# Patient Record
Sex: Female | Born: 1950 | Race: White | Hispanic: No | Marital: Married | Smoking: Never smoker
Health system: Southern US, Community
[De-identification: ages and names within clinical notes are randomized; demographics above are authoritative.]

## PROBLEM LIST (undated history)

## (undated) DIAGNOSIS — F329 Major depressive disorder, single episode, unspecified: Secondary | ICD-10-CM

## (undated) DIAGNOSIS — F411 Generalized anxiety disorder: Secondary | ICD-10-CM

## (undated) DIAGNOSIS — E039 Hypothyroidism, unspecified: Secondary | ICD-10-CM

## (undated) DIAGNOSIS — R079 Chest pain, unspecified: Secondary | ICD-10-CM

## (undated) DIAGNOSIS — G4700 Insomnia, unspecified: Secondary | ICD-10-CM

## (undated) DIAGNOSIS — I10 Essential (primary) hypertension: Secondary | ICD-10-CM

## (undated) DIAGNOSIS — E7800 Pure hypercholesterolemia, unspecified: Secondary | ICD-10-CM

## (undated) DIAGNOSIS — N950 Postmenopausal bleeding: Secondary | ICD-10-CM

## (undated) DIAGNOSIS — R109 Unspecified abdominal pain: Secondary | ICD-10-CM

## (undated) DIAGNOSIS — R011 Cardiac murmur, unspecified: Secondary | ICD-10-CM

## (undated) DIAGNOSIS — G1221 Amyotrophic lateral sclerosis: Secondary | ICD-10-CM

## (undated) DIAGNOSIS — IMO0001 Reserved for inherently not codable concepts without codable children: Secondary | ICD-10-CM

## (undated) DIAGNOSIS — F3289 Other specified depressive episodes: Secondary | ICD-10-CM

## (undated) HISTORY — DX: Insomnia, unspecified: G47.00

## (undated) HISTORY — DX: Pure hypercholesterolemia, unspecified: E78.00

## (undated) HISTORY — DX: Major depressive disorder, single episode, unspecified: F32.9

## (undated) HISTORY — DX: Chest pain, unspecified: R07.9

## (undated) HISTORY — DX: Cardiac murmur, unspecified: R01.1

## (undated) HISTORY — PX: DILATION AND CURETTAGE OF UTERUS: SHX78

## (undated) HISTORY — DX: Hypothyroidism, unspecified: E03.9

## (undated) HISTORY — DX: Amyotrophic lateral sclerosis: G12.21

## (undated) HISTORY — DX: Other specified depressive episodes: F32.89

## (undated) HISTORY — DX: Unspecified abdominal pain: R10.9

## (undated) HISTORY — DX: Generalized anxiety disorder: F41.1

## (undated) HISTORY — DX: Postmenopausal bleeding: N95.0

## (undated) HISTORY — PX: OTHER SURGICAL HISTORY: SHX169

## (undated) HISTORY — DX: Essential (primary) hypertension: I10

---

## 2006-09-30 ENCOUNTER — Emergency Department: Payer: Self-pay | Admitting: Emergency Medicine

## 2009-08-16 HISTORY — PX: COLONOSCOPY: SHX174

## 2009-08-16 LAB — HM COLONOSCOPY: HM Colonoscopy: NORMAL

## 2009-10-23 ENCOUNTER — Ambulatory Visit: Payer: Self-pay | Admitting: Family Medicine

## 2010-01-19 ENCOUNTER — Ambulatory Visit: Payer: Self-pay | Admitting: Gastroenterology

## 2010-05-08 LAB — HM PAP SMEAR

## 2011-12-21 DIAGNOSIS — E7800 Pure hypercholesterolemia, unspecified: Secondary | ICD-10-CM | POA: Diagnosis not present

## 2011-12-21 DIAGNOSIS — Z0000 Encounter for general adult medical examination without abnormal findings: Secondary | ICD-10-CM | POA: Diagnosis not present

## 2011-12-21 DIAGNOSIS — E039 Hypothyroidism, unspecified: Secondary | ICD-10-CM | POA: Diagnosis not present

## 2011-12-21 DIAGNOSIS — R079 Chest pain, unspecified: Secondary | ICD-10-CM | POA: Diagnosis not present

## 2011-12-27 DIAGNOSIS — I209 Angina pectoris, unspecified: Secondary | ICD-10-CM | POA: Diagnosis not present

## 2011-12-29 DIAGNOSIS — E039 Hypothyroidism, unspecified: Secondary | ICD-10-CM | POA: Diagnosis not present

## 2011-12-29 DIAGNOSIS — N950 Postmenopausal bleeding: Secondary | ICD-10-CM | POA: Diagnosis not present

## 2011-12-29 DIAGNOSIS — E7800 Pure hypercholesterolemia, unspecified: Secondary | ICD-10-CM | POA: Diagnosis not present

## 2012-01-04 DIAGNOSIS — F411 Generalized anxiety disorder: Secondary | ICD-10-CM | POA: Diagnosis not present

## 2012-01-04 DIAGNOSIS — R109 Unspecified abdominal pain: Secondary | ICD-10-CM | POA: Diagnosis not present

## 2012-01-04 DIAGNOSIS — R079 Chest pain, unspecified: Secondary | ICD-10-CM | POA: Diagnosis not present

## 2012-01-11 DIAGNOSIS — N950 Postmenopausal bleeding: Secondary | ICD-10-CM | POA: Diagnosis not present

## 2012-01-25 DIAGNOSIS — F411 Generalized anxiety disorder: Secondary | ICD-10-CM | POA: Diagnosis not present

## 2012-01-25 DIAGNOSIS — R0602 Shortness of breath: Secondary | ICD-10-CM | POA: Diagnosis not present

## 2012-01-25 DIAGNOSIS — R079 Chest pain, unspecified: Secondary | ICD-10-CM | POA: Diagnosis not present

## 2012-01-25 DIAGNOSIS — F329 Major depressive disorder, single episode, unspecified: Secondary | ICD-10-CM | POA: Diagnosis not present

## 2012-01-27 DIAGNOSIS — N950 Postmenopausal bleeding: Secondary | ICD-10-CM | POA: Diagnosis not present

## 2012-02-01 ENCOUNTER — Ambulatory Visit: Payer: Self-pay | Admitting: Family Medicine

## 2012-02-01 DIAGNOSIS — R0602 Shortness of breath: Secondary | ICD-10-CM | POA: Diagnosis not present

## 2012-02-02 DIAGNOSIS — N950 Postmenopausal bleeding: Secondary | ICD-10-CM | POA: Diagnosis not present

## 2012-02-02 DIAGNOSIS — N393 Stress incontinence (female) (male): Secondary | ICD-10-CM | POA: Diagnosis not present

## 2012-02-02 DIAGNOSIS — N859 Noninflammatory disorder of uterus, unspecified: Secondary | ICD-10-CM | POA: Diagnosis not present

## 2012-02-11 DIAGNOSIS — N814 Uterovaginal prolapse, unspecified: Secondary | ICD-10-CM | POA: Diagnosis not present

## 2012-02-11 DIAGNOSIS — N393 Stress incontinence (female) (male): Secondary | ICD-10-CM | POA: Diagnosis not present

## 2012-02-18 DIAGNOSIS — N950 Postmenopausal bleeding: Secondary | ICD-10-CM | POA: Diagnosis not present

## 2012-02-18 DIAGNOSIS — N393 Stress incontinence (female) (male): Secondary | ICD-10-CM | POA: Diagnosis not present

## 2012-04-16 HISTORY — PX: ABDOMINAL HYSTERECTOMY: SHX81

## 2012-04-24 ENCOUNTER — Ambulatory Visit: Payer: Self-pay | Admitting: Obstetrics & Gynecology

## 2012-04-24 DIAGNOSIS — Z01818 Encounter for other preprocedural examination: Secondary | ICD-10-CM | POA: Diagnosis not present

## 2012-04-24 DIAGNOSIS — N393 Stress incontinence (female) (male): Secondary | ICD-10-CM | POA: Diagnosis not present

## 2012-04-24 DIAGNOSIS — Z01812 Encounter for preprocedural laboratory examination: Secondary | ICD-10-CM | POA: Diagnosis not present

## 2012-04-24 DIAGNOSIS — Z01810 Encounter for preprocedural cardiovascular examination: Secondary | ICD-10-CM | POA: Diagnosis not present

## 2012-04-24 DIAGNOSIS — N950 Postmenopausal bleeding: Secondary | ICD-10-CM | POA: Diagnosis not present

## 2012-04-24 LAB — BASIC METABOLIC PANEL
Anion Gap: 6 — ABNORMAL LOW (ref 7–16)
BUN: 17 mg/dL (ref 7–18)
Chloride: 109 mmol/L — ABNORMAL HIGH (ref 98–107)
Creatinine: 0.78 mg/dL (ref 0.60–1.30)
Potassium: 4 mmol/L (ref 3.5–5.1)

## 2012-04-24 LAB — CBC
MCV: 94 fL (ref 80–100)
Platelet: 215 10*3/uL (ref 150–440)
RBC: 3.73 10*6/uL — ABNORMAL LOW (ref 3.80–5.20)
RDW: 12.7 % (ref 11.5–14.5)
WBC: 5.9 10*3/uL (ref 3.6–11.0)

## 2012-04-24 LAB — PROTIME-INR: INR: 0.9

## 2012-04-24 LAB — APTT: Activated PTT: 30.7 secs (ref 23.6–35.9)

## 2012-05-04 ENCOUNTER — Inpatient Hospital Stay: Payer: Self-pay | Admitting: Obstetrics & Gynecology

## 2012-05-04 DIAGNOSIS — F329 Major depressive disorder, single episode, unspecified: Secondary | ICD-10-CM | POA: Diagnosis present

## 2012-05-04 DIAGNOSIS — N393 Stress incontinence (female) (male): Secondary | ICD-10-CM | POA: Diagnosis present

## 2012-05-04 DIAGNOSIS — N950 Postmenopausal bleeding: Secondary | ICD-10-CM | POA: Diagnosis not present

## 2012-05-04 DIAGNOSIS — Z79899 Other long term (current) drug therapy: Secondary | ICD-10-CM | POA: Diagnosis not present

## 2012-05-04 DIAGNOSIS — Z7982 Long term (current) use of aspirin: Secondary | ICD-10-CM | POA: Diagnosis not present

## 2012-05-04 DIAGNOSIS — N814 Uterovaginal prolapse, unspecified: Secondary | ICD-10-CM | POA: Diagnosis not present

## 2012-05-05 LAB — HEMOGLOBIN: HGB: 10.4 g/dL — ABNORMAL LOW (ref 12.0–16.0)

## 2012-05-08 LAB — PATHOLOGY REPORT

## 2012-05-16 ENCOUNTER — Encounter: Payer: Self-pay | Admitting: Family Medicine

## 2012-05-16 ENCOUNTER — Ambulatory Visit (INDEPENDENT_AMBULATORY_CARE_PROVIDER_SITE_OTHER): Payer: Medicare Other | Admitting: Family Medicine

## 2012-05-16 VITALS — BP 132/88 | HR 66 | Temp 97.9°F | Resp 16 | Ht 63.0 in | Wt 234.0 lb

## 2012-05-16 DIAGNOSIS — K055 Other periodontal diseases: Secondary | ICD-10-CM | POA: Diagnosis not present

## 2012-05-16 DIAGNOSIS — G4700 Insomnia, unspecified: Secondary | ICD-10-CM | POA: Diagnosis not present

## 2012-05-16 DIAGNOSIS — E039 Hypothyroidism, unspecified: Secondary | ICD-10-CM

## 2012-05-16 DIAGNOSIS — L509 Urticaria, unspecified: Secondary | ICD-10-CM

## 2012-05-16 DIAGNOSIS — Z23 Encounter for immunization: Secondary | ICD-10-CM | POA: Diagnosis not present

## 2012-05-16 DIAGNOSIS — F341 Dysthymic disorder: Secondary | ICD-10-CM

## 2012-05-16 DIAGNOSIS — N939 Abnormal uterine and vaginal bleeding, unspecified: Secondary | ICD-10-CM

## 2012-05-16 DIAGNOSIS — F329 Major depressive disorder, single episode, unspecified: Secondary | ICD-10-CM

## 2012-05-16 LAB — CBC WITH DIFFERENTIAL/PLATELET
Basophils Absolute: 0 10*3/uL (ref 0.0–0.1)
Basophils Relative: 0 % (ref 0–1)
Eosinophils Absolute: 0.4 10*3/uL (ref 0.0–0.7)
Hemoglobin: 11.5 g/dL — ABNORMAL LOW (ref 12.0–15.0)
MCH: 31.9 pg (ref 26.0–34.0)
MCHC: 34.4 g/dL (ref 30.0–36.0)
Monocytes Relative: 7 % (ref 3–12)
Neutro Abs: 4.4 10*3/uL (ref 1.7–7.7)
Neutrophils Relative %: 56 % (ref 43–77)
Platelets: 278 10*3/uL (ref 150–400)
RDW: 12.9 % (ref 11.5–15.5)

## 2012-05-16 LAB — COMPREHENSIVE METABOLIC PANEL
AST: 11 U/L (ref 0–37)
BUN: 14 mg/dL (ref 6–23)
CO2: 27 mEq/L (ref 19–32)
Calcium: 9.6 mg/dL (ref 8.4–10.5)
Chloride: 107 mEq/L (ref 96–112)
Creat: 0.85 mg/dL (ref 0.50–1.10)
Total Bilirubin: 0.3 mg/dL (ref 0.3–1.2)

## 2012-05-16 MED ORDER — ALPRAZOLAM 0.5 MG PO TABS: 0.5000 mg | ORAL_TABLET | Freq: Every evening | ORAL | Status: DC | PRN

## 2012-05-16 NOTE — Progress Notes (Signed)
Subjective:    Patient ID: Carolyn Campos, female    DOB: March 24, 1951, 61 y.o.   MRN: [id number]  HPIThis 61 y.o. female presents to establish care and for six month follow-up:  1.  Vaginal bleeding: s/p hysterectomy without oophorectomy; bladder lift; unable to remove ovary. Had to wear a catheter for three days.  Now able to drive.  Surgery one week ago.  [facility].  Prescribed Keflex but had reaction.  Pap smear every 5 years per [facility].  Unable to do anything for six weeks.    2.  Anxiety/Depression: stable; no SI/HI.  Getting down.  Told husband she was going to leave him but he didn't know what he would do.  Married x 41 years.  Does her own thing.  Taking Zoloft 150mg  daily; working well.  Ran out of Xanax; needed appointment.  Taking Xanax PRN; almost taking daily.  Usually will need Xanax at night; sometimes will need during the day.  Still taking Trazodone qhs for insomnia.  3.  Hypothyroidism:  Due for repeat thyroid function.  Good compliance with medication; good tolerance to medication; good symptom control.   4.  Flu vaccine: waits until October at pharmacy.    5.  Urticaria: onset with recent surgery/hysterectomy.  taking Benadryl. No throat swelling; no SOB.  No history of PCN/Amoxicillin allergy.  Did prescribe narcotics with recent surgery.  Much better now.    Review of Systems  Constitutional: Negative for fever, chills, diaphoresis and fatigue.  Respiratory: Negative for shortness of breath and wheezing.   Cardiovascular: Negative for chest pain, palpitations and leg swelling.  Gastrointestinal: Negative for nausea, vomiting, abdominal pain and diarrhea.  Genitourinary: Negative for vaginal pain, menstrual problem and pelvic pain.  Skin: Positive for rash.  Psychiatric/Behavioral: Positive for sleep disturbance and dysphoric mood. Negative for suicidal ideas and self-injury. The patient is nervous/anxious.     Past Medical History  Diagnosis Date  . Pure  hypercholesterolemia   . Chest pain, unspecified   . Postmenopausal bleeding   . Abdominal pain, unspecified site   . Chest pain, unspecified   . Anxiety state, unspecified   . Depressive disorder, not elsewhere classified   . Unspecified hypothyroidism   . Insomnia, unspecified     Past Surgical History  Procedure Date  . Cesarean section     x 2  . Dilation and curettage of uterus   . Arm surgery     x 4 post accident at work, with skin graft, [facility]    Prior to Admission medications   Medication Sig Start Date End Date Taking? Authorizing Provider  ALPRAZolam Prudy Feeler) 0.5 MG tablet Take 1 tablet (0.5 mg total) by mouth at bedtime as needed. 05/16/12  Yes Ethelda Chick, MD  aspirin 81 MG tablet Take 81 mg by mouth daily.    Historical Provider, MD  Fiber TABS 500 mg. Take one daily.    Historical Provider, MD  fish oil-omega-3 fatty acids 1000 MG capsule Take 2 g by mouth daily.    Historical Provider, MD  Hydrocodone-Acetaminophen 2.5-325 MG TABS Take by mouth 4 (four) times daily as needed.   Yes Historical Provider, MD  levothyroxine (SYNTHROID, LEVOTHROID) 88 MCG tablet Take 88 mcg by mouth daily.    Historical Provider, MD  sertraline (ZOLOFT) 100 MG tablet Take 100 mg by mouth daily. Takes 1.5 tabs daily   Yes Historical Provider, MD  traZODone (DESYREL) 50 MG tablet Take 50 mg by mouth at bedtime as needed.  Yes Historical Provider, MD    Allergies  Allergen Reactions  . Cephalexin Rash    History   Social History  . Marital Status: Unknown    Spouse Name: N/A    Number of Children: 2  . Years of Education: 12   Occupational History  . disabled     due to arm surgery   Social History Main Topics  . Smoking status: Never Smoker   . Smokeless tobacco: Not on file  . Alcohol Use: No  . Drug Use: No  . Sexually Active: Not on file   Other Topics Concern  . Not on file   Social History Narrative   Not sexually active. Always uses seat belts. Smoke  alarm and carbon monoxide detector in the home. Has no unsecured guns in the home. Does not consume any caffeine. Exercise: walks daily for 30 minutes walks 1-2 miles about every other day. Has dog. Married x 40 years; happily married; no abuse; 2 daughters, 3 grandchildren/sons. Organ donor: Yes. Living Will: Patient does not have living will, DOES have HCPOA.    Family History  Problem Relation Age of Onset  . Heart disease Brother     from drug abuse  . COPD Mother   . Cancer Father     lung, prostate  . Heart disease Father   . Ulcers Brother        Objective:   Physical Exam  Nursing note and vitals reviewed. Constitutional: She is oriented to person, place, and time. She appears well-developed and well-nourished. No distress.  Eyes: Conjunctivae normal are normal. Pupils are equal, round, and reactive to light.  Neck: Normal range of motion. Neck supple. No JVD present. No thyromegaly present.  Cardiovascular: Normal rate, regular rhythm, normal heart sounds and intact distal pulses.  Exam reveals no gallop and no friction rub.   No murmur heard. Pulmonary/Chest: Effort normal and breath sounds normal. She has no wheezes. She has no rales.  Abdominal: Soft. Bowel sounds are normal.  Lymphadenopathy:    She has no cervical adenopathy.  Neurological: She is alert and oriented to person, place, and time.  Skin: Rash noted. Rash is urticarial. She is not diaphoretic.  Psychiatric: She has a normal mood and affect. Her behavior is normal. Judgment and thought content normal.       Assessment & Plan:   1. Unspecified hypothyroidism   2. Anxiety and depression   3. Insomnia   4. Vaginal bleeding   5. Urticaria    Meds ordered this encounter  Medications  . Hydrocodone-Acetaminophen 2.5-325 MG TABS    Sig: Take by mouth 4 (four) times daily as needed.  . traZODone (DESYREL) 50 MG tablet    Sig: Take 50 mg by mouth at bedtime as needed.  . sertraline (ZOLOFT) 100 MG tablet     Sig: Take 100 mg by mouth daily. Takes 1.5 tabs daily  . DISCONTD: ALPRAZolam (XANAX) 0.5 MG tablet    Sig: Take 0.5 mg by mouth at bedtime as needed.  . ALPRAZolam (XANAX) 0.5 MG tablet    Sig: Take 1 tablet (0.5 mg total) by mouth at bedtime as needed.    Dispense:  30 tablet    Refill:  5

## 2012-05-16 NOTE — Patient Instructions (Addendum)
1. Unspecified hypothyroidism  CBC with Differential, TSH, T4, free, Comprehensive metabolic panel  2. Anxiety and depression  ALPRAZolam (XANAX) 0.5 MG tablet  3. Insomnia  ALPRAZolam (XANAX) 0.5 MG tablet  4. Vaginal bleeding    5. Urticaria       START TAKING ZYRTEC 10MG  ONE EVERY MORNING; CONTINUE BENADRYL AT NIGHTTIME FOR RASH.

## 2012-05-17 LAB — T4, FREE: Free T4: 1.02 ng/dL (ref 0.80–1.80)

## 2012-05-17 LAB — TSH: TSH: 4.2 u[IU]/mL (ref 0.350–4.500)

## 2012-05-25 ENCOUNTER — Encounter: Payer: Self-pay | Admitting: Radiology

## 2012-05-29 ENCOUNTER — Other Ambulatory Visit: Payer: Self-pay | Admitting: *Deleted

## 2012-05-29 DIAGNOSIS — E039 Hypothyroidism, unspecified: Secondary | ICD-10-CM

## 2012-05-29 DIAGNOSIS — D649 Anemia, unspecified: Secondary | ICD-10-CM

## 2012-05-29 DIAGNOSIS — R5381 Other malaise: Secondary | ICD-10-CM

## 2012-05-29 DIAGNOSIS — F329 Major depressive disorder, single episode, unspecified: Secondary | ICD-10-CM

## 2012-05-30 ENCOUNTER — Encounter: Payer: Self-pay | Admitting: *Deleted

## 2012-06-01 ENCOUNTER — Encounter: Payer: Self-pay | Admitting: Family Medicine

## 2012-07-21 DIAGNOSIS — E039 Hypothyroidism, unspecified: Secondary | ICD-10-CM | POA: Insufficient documentation

## 2012-07-21 DIAGNOSIS — G4700 Insomnia, unspecified: Secondary | ICD-10-CM | POA: Insufficient documentation

## 2012-07-21 DIAGNOSIS — L509 Urticaria, unspecified: Secondary | ICD-10-CM | POA: Insufficient documentation

## 2012-07-21 DIAGNOSIS — F32A Depression, unspecified: Secondary | ICD-10-CM | POA: Insufficient documentation

## 2012-07-21 DIAGNOSIS — F329 Major depressive disorder, single episode, unspecified: Secondary | ICD-10-CM | POA: Insufficient documentation

## 2012-07-21 DIAGNOSIS — N939 Abnormal uterine and vaginal bleeding, unspecified: Secondary | ICD-10-CM | POA: Insufficient documentation

## 2012-07-21 NOTE — Assessment & Plan Note (Signed)
Stable; obtain labs; continue current dose of medication.

## 2012-07-21 NOTE — Assessment & Plan Note (Signed)
Persistent but stable.  No changes in medication; refill of Xanax provided.

## 2012-07-21 NOTE — Assessment & Plan Note (Signed)
Resolved; s/p hysterectomy in past month.

## 2012-07-21 NOTE — Assessment & Plan Note (Signed)
New.  Continue Benadryl PRN. Recommend Zyrtec 10mg  daily.  Also recommend Zantac 150mg  bid.  To ED for facial swelling, throat swelling, SOB.

## 2012-07-21 NOTE — Assessment & Plan Note (Signed)
Controlled no change in medication 

## 2012-07-22 NOTE — Progress Notes (Signed)
Reviewed and agree.

## 2012-09-05 ENCOUNTER — Telehealth: Payer: Self-pay | Admitting: *Deleted

## 2012-09-05 MED ORDER — SERTRALINE HCL 100 MG PO TABS: 100.0000 mg | ORAL_TABLET | Freq: Every day | ORAL | Status: DC

## 2012-09-05 NOTE — Telephone Encounter (Signed)
Sent to pharmacy 

## 2012-09-05 NOTE — Telephone Encounter (Signed)
Pharmacy requesting refill on sertraline 100mg . Last fill 08/14/12

## 2012-11-14 ENCOUNTER — Encounter: Payer: Self-pay | Admitting: Family Medicine

## 2012-11-14 ENCOUNTER — Ambulatory Visit (INDEPENDENT_AMBULATORY_CARE_PROVIDER_SITE_OTHER): Payer: Medicare Other | Admitting: Family Medicine

## 2012-11-14 ENCOUNTER — Ambulatory Visit: Payer: Medicare Other

## 2012-11-14 VITALS — BP 130/72 | HR 65 | Temp 97.5°F | Resp 16 | Ht 63.0 in | Wt 238.4 lb

## 2012-11-14 DIAGNOSIS — F329 Major depressive disorder, single episode, unspecified: Secondary | ICD-10-CM

## 2012-11-14 DIAGNOSIS — R0989 Other specified symptoms and signs involving the circulatory and respiratory systems: Secondary | ICD-10-CM | POA: Diagnosis not present

## 2012-11-14 DIAGNOSIS — G4700 Insomnia, unspecified: Secondary | ICD-10-CM

## 2012-11-14 DIAGNOSIS — R7309 Other abnormal glucose: Secondary | ICD-10-CM | POA: Diagnosis not present

## 2012-11-14 DIAGNOSIS — R079 Chest pain, unspecified: Secondary | ICD-10-CM

## 2012-11-14 DIAGNOSIS — R0609 Other forms of dyspnea: Secondary | ICD-10-CM | POA: Diagnosis not present

## 2012-11-14 DIAGNOSIS — F32A Depression, unspecified: Secondary | ICD-10-CM

## 2012-11-14 DIAGNOSIS — F419 Anxiety disorder, unspecified: Secondary | ICD-10-CM

## 2012-11-14 DIAGNOSIS — Z0000 Encounter for general adult medical examination without abnormal findings: Secondary | ICD-10-CM | POA: Diagnosis not present

## 2012-11-14 DIAGNOSIS — M7631 Iliotibial band syndrome, right leg: Secondary | ICD-10-CM

## 2012-11-14 DIAGNOSIS — E039 Hypothyroidism, unspecified: Secondary | ICD-10-CM | POA: Diagnosis not present

## 2012-11-14 DIAGNOSIS — D649 Anemia, unspecified: Secondary | ICD-10-CM

## 2012-11-14 DIAGNOSIS — E7800 Pure hypercholesterolemia, unspecified: Secondary | ICD-10-CM

## 2012-11-14 DIAGNOSIS — L210 Seborrhea capitis: Secondary | ICD-10-CM

## 2012-11-14 DIAGNOSIS — Z1239 Encounter for other screening for malignant neoplasm of breast: Secondary | ICD-10-CM

## 2012-11-14 LAB — CBC WITH DIFFERENTIAL/PLATELET
Basophils Absolute: 0 10*3/uL (ref 0.0–0.1)
Basophils Relative: 1 % (ref 0–1)
Eosinophils Absolute: 0.1 10*3/uL (ref 0.0–0.7)
Eosinophils Relative: 2 % (ref 0–5)
HCT: 35.8 % — ABNORMAL LOW (ref 36.0–46.0)
MCH: 31 pg (ref 26.0–34.0)
MCHC: 35.2 g/dL (ref 30.0–36.0)
Monocytes Absolute: 0.5 10*3/uL (ref 0.1–1.0)
Monocytes Relative: 8 % (ref 3–12)
Neutro Abs: 2.9 10*3/uL (ref 1.7–7.7)
RDW: 13.8 % (ref 11.5–15.5)

## 2012-11-14 LAB — POCT UA - MICROSCOPIC ONLY
Crystals, Ur, HPF, POC: NEGATIVE
Yeast, UA: NEGATIVE

## 2012-11-14 LAB — POCT URINALYSIS DIPSTICK
Bilirubin, UA: NEGATIVE
Blood, UA: NEGATIVE
Glucose, UA: NEGATIVE
Ketones, UA: NEGATIVE
Spec Grav, UA: 1.015
pH, UA: 6.5

## 2012-11-14 LAB — LIPID PANEL
Cholesterol: 232 mg/dL — ABNORMAL HIGH (ref 0–200)
HDL: 54 mg/dL (ref >39)
LDL Cholesterol: 155 mg/dL — ABNORMAL HIGH (ref 0–99)
Triglycerides: 116 mg/dL (ref <150)
VLDL: 23 mg/dL (ref 0–40)

## 2012-11-14 LAB — T4, FREE: Free T4: 0.84 ng/dL (ref 0.80–1.80)

## 2012-11-14 MED ORDER — ALPRAZOLAM 0.5 MG PO TABS: 0.5000 mg | ORAL_TABLET | Freq: Every evening | ORAL | Status: DC | PRN

## 2012-11-14 MED ORDER — LEVOTHYROXINE SODIUM 88 MCG PO TABS: 88.0000 ug | ORAL_TABLET | Freq: Every day | ORAL | Status: DC

## 2012-11-14 MED ORDER — TRAZODONE HCL 50 MG PO TABS: 50.0000 mg | ORAL_TABLET | Freq: Every evening | ORAL | Status: DC | PRN

## 2012-11-14 MED ORDER — SERTRALINE HCL 100 MG PO TABS: 100.0000 mg | ORAL_TABLET | Freq: Every day | ORAL | Status: DC

## 2012-11-14 MED ORDER — NAPROXEN 500 MG PO TABS: 500.0000 mg | ORAL_TABLET | Freq: Two times a day (BID) | ORAL | Status: DC

## 2012-11-14 MED ORDER — TRIAMCINOLONE ACETONIDE 0.1 % EX CREA: TOPICAL_CREAM | Freq: Two times a day (BID) | CUTANEOUS | Status: AC

## 2012-11-14 NOTE — Patient Instructions (Addendum)
Routine general medical examination at a health care facility - Plan: POCT urinalysis dipstick, Lipid panel, EKG 12-Lead  Unspecified hypothyroidism - Plan: TSH, T4, free, Lipid panel, levothyroxine (SYNTHROID, LEVOTHROID) 88 MCG tablet  Anemia, unspecified - Plan: CBC with Differential, Lipid panel  Other abnormal glucose - Plan: Hemoglobin A1c, POCT glucose (manual entry), Lipid panel  Chest pain - Plan: DG Chest 2 View, Ambulatory referral to Cardiology  Dyspnea on exertion - Plan: DG Chest 2 View  Seborrhea capitis - Plan: triamcinolone cream (KENALOG) 0.1 %  Pure hypercholesterolemia  Anxiety and depression - Plan: ALPRAZolam (XANAX) 0.5 MG tablet  Insomnia - Plan: ALPRAZolam (XANAX) 0.5 MG tablet, traZODone (DESYREL) 50 MG tablet  Depression - Plan: sertraline (ZOLOFT) 100 MG tablet  Breast cancer screening - Plan: MM Digital Screening    1.  CALL INSURANCE REGARDING SHINGLES VACCINE COVERAGE (ZOSTAVAX). 2.  CALL [facility] TO SCHEDULE MAMMOGRAM. 3.  RETURN IN THREE MONTHS FOR FOLLOW-UP.  4.  WE WILL CALL YOU IN UPCOMING WEEK WITH REFERRAL TO CARDIOLOGY.Iliotibial Band Syndrome with Rehab The iliotibial (IT) band is a tendon that connects the hip muscles to the shinbone (tibia) and to one of the bones of the pelvis (ileum). The IT band passes by the knee and is often irritated by the outer portion of the knee (lateral femoral condyle). A fluid filled sac (bursa) exists between the tendon and the bone, to cushion and reduce friction. Overuse of the tendon may cause excessive friction, which results in IT band syndrome. This condition involves inflammation of the bursa (bursitis) and/or inflammation of the IT band (tendinitis). SYMPTOMS   Pain, tenderness, swelling, warmth, or redness over the IT band, at the outer knee (above the joint).  Pain that travels up or down the thigh or leg.  Initially, pain at the beginning of an exercise, that decreases once warmed  up. Eventually, pain throughout the activity, getting worse as the activity continues. May cause the athlete to stop in the middle of training or competing.  Pain that gets worse when running down hills or stairs, on banked tracks, or next to the curb on the street.  Pain that increases when the foot of the affected leg hits the ground.  Possibly, a crackling sound (crepitation) when the tendon or bursa is moved or touched. CAUSES  IT band syndrome is caused by irritation of the IT band and the underlying bursa. This eventually results in inflammation and pain. IT band syndrome is an overuse injury.  RISK INCREASES WITH:  Sports with repetitive knee-bending activities (distance running, cycling).  Incorrect training techniques, including sudden changes in the intensity, frequency, or duration of training.  Not enough rest between workouts.  Poor strength and flexibility, especially a tight IT band.  Failure to warm up properly before activity.  Bow legs.  Arthritis of the knee. PREVENTION   Warm up and stretch properly before activity.  Allow for adequate recovery between workouts.  Maintain physical fitness:  Strength, flexibility, and endurance.  Cardiovascular fitness.  Learn and use proper training technique, including reducing running mileage, shortening stride, and avoiding running on hills and banked surfaces.  Wear arch supports (orthotics), if you have flat feet. PROGNOSIS  If treated properly, IT band syndrome usually goes away within 6 weeks of treatment. RELATED COMPLICATIONS   Longer healing time, if not properly treated, or if not given enough time to heal.  Recurring inflammation of the tendon and bursa, that may result in a chronic condition.  Recurring symptoms, if activity is resumed too soon, with overuse, with a direct blow, or with poor training technique.  Inability to complete training or competition. TREATMENT  Treatment first involves the  use of ice and medicine, to reduce pain and inflammation. The use of strengthening and stretching exercises may help reduce pain with activity. These exercises may be performed at home or with a therapist. For individuals with flat feet, an arch support (orthotic) may be helpful. Some individuals find that wearing a knee sleeve or compression bandage around the knee during workouts provides some relief. Certain training techniques, such as adjusting stride length, avoiding running on hills or stairs, changing the direction you run on a circular or banked track, or changing the side of the road you run on, if you run next to the curb, may help decrease symptoms of IT band syndrome. Cyclists may need to change the seat height or foot position on their bicycles. An injection of cortisone into the bursa may be recommended. Surgery to remove the inflamed bursa and/or part of the IT band is only considered after at least 6 months of non-surgical treatment.  MEDICATION   If pain medicine is needed, nonsteroidal anti-inflammatory medicines (aspirin and ibuprofen), or other minor pain relievers (acetaminophen), are often advised.  Do not take pain medicine for 7 days before surgery.  Prescription pain relievers may be given, if your caregiver thinks they are needed. Use only as directed and only as much as you need.  Corticosteroid injections may be given by your caregiver. These injections should be reserved for the most serious cases, because they may only be given a certain number of times. HEAT AND COLD  Cold treatment (icing) should be applied for 10 to 15 minutes every 2 to 3 hours for inflammation and pain, and immediately after activity that aggravates your symptoms. Use ice packs or an ice massage.  Heat treatment may be used before performing stretching and strengthening activities prescribed by your caregiver, physical therapist, or athletic trainer. Use a heat pack or a warm water soak. SEEK  MEDICAL CARE IF:   Symptoms get worse or do not improve in 2 to 4 weeks, despite treatment.  New, unexplained symptoms develop. (Drugs used in treatment may produce side effects.) EXERCISES  RANGE OF MOTION (ROM) AND STRETCHING EXERCISES - Iliotibial Band Syndrome These exercises may help you when beginning to rehabilitate your injury. Your symptoms may go away with or without further involvement from your physician, physical therapist or athletic trainer. While completing these exercises, remember:   Restoring tissue flexibility helps normal motion to return to the joints. This allows healthier, less painful movement and activity.  An effective stretch should be held for at least 30 seconds.  A stretch should never be painful. You should only feel a gentle lengthening or release in the stretched tissue. STRETCH - Quadriceps, Prone   Lie on your stomach on a firm surface, such as a bed or padded floor.  Bend your right / left knee and grasp your ankle. If you are unable to reach your ankle or pant leg, use a belt around your foot to lengthen your reach.  Gently pull your heel toward your buttocks. Your knee should not slide out to the side. You should feel a stretch in the front of your thigh and knee.  Hold this position for __________ seconds. Repeat __________ times. Complete this stretch __________ times per day.  STRETCH  Iliotibial Band  On the floor or bed, lie  on your side, so your right / left leg is on top. Bend your knee and grab your ankle.  Slowly bring your knee back so that your thigh is in line with your trunk. Keep your heel at your buttocks and gently arch your back, so your head, shoulders and hips line up.  Slowly lower your leg so that your knee approaches the floor or bed, until you feel a gentle stretch on the outside of your right / left thigh. If you do not feel a stretch and your knee will not fall farther, place the heel of your opposite foot on top of your  knee, and pull your thigh down farther.  Hold this stretch for __________ seconds. Repeat __________ times. Complete this stretch __________ times per day. STRENGTHENING EXERCISES - Iliotibial Band Syndrome Improving the flexibility of the IT band will best relieve your discomfort due to IT band syndrome. Strengthening exercises, however, can help improve both muscle endurance and joint mechanics, reducing the factors that can contribute to this condition. Your physician, physical therapist or athletic trainer may provide you with exercises that train specific muscle groups that are especially weak. The following exercises target muscles that are often weak in people who have IT band syndrome. STRENGTH - Hip Abductors, Straight Leg Raises  Be aware of your form throughout the entire exercise, so that you exercise the correct muscles. Poor form means that you are not strengthening the correct muscles.  Lie on your side, so that your head, shoulders, knee and hip line up. You may bend your lower knee to help maintain your balance. Your right / left leg should be on top.  Roll your hips slightly forward, so that your hips are stacked directly over each other and your right / left knee is facing forward.  Lift your top leg up 4-6 inches, leading with your heel. Be sure that your foot does not drift forward and that your knee does not roll toward the ceiling.  Hold this position for __________ seconds. You should feel the muscles in your outer hip lifting (you may not notice this until your leg begins to tire).  Slowly lower your leg to the starting position. Allow the muscles to fully relax before beginning the next repetition. Repeat __________ times. Complete this exercise __________ times per day.  STRENGTH - Quad/VMO, Isometric  Sit in a chair with your right / left knee slightly bent. With your fingertips, feel the VMO muscle (just above the inside of your knee). The VMO is important in  controlling the position of your kneecap.  Keeping your fingertips on this muscle. Without actually moving your leg, attempt to drive your knee down, as if straightening your leg. You should feel your VMO tense. If you have a difficult time, you may wish to try the same exercise on your healthy knee first.  Tense this muscle as hard as you can, without increasing any knee pain.  Hold for __________ seconds. Relax the muscles slowly and completely between each repetition. Repeat __________ times. Complete this exercise __________ times per day.  Document Released: 08/02/2005 Document Revised: 10/25/2011 Document Reviewed: 11/14/2008 [facility] Patient Information 2013 [place], [place].

## 2012-11-14 NOTE — Progress Notes (Signed)
[address]   [place], [place]  [address]   [phone]  Subjective:    Patient ID: Carolyn Campos, female    DOB: Dec 24, 1950, 62 y.o.   MRN: [id number]  HPI This 62 y.o. female presents for evaluation for CPE.    Last annual wellness exam 2011. Pap smear 05/08/10; s/p gynecology consultation 03/2012 [facility]. Mammogram  10/23/09 [facility]. Colonoscopy 08/16/09; normal; repeat ten years. Tetanus 2009. Influenza vaccine fall 2013 [facility]. Zostavax never. Pneumovax never. Eye exam several years ago; +glasses. Dental exam several years.  SOB:  Worsening; cardiolite negative last year at [facility].  No CT chest.  No echocardiogram of heart last year.  D-dimer negative.  Mother with pulmonary fibrosis.  S/p CXR Spring 2013.  Will be walking, will have chest pain L sided; L sided chest tightness; in throat also.  No sharp pain; dull pressure on L sided. Occurs with walking up a small incline.  No orthopnea.  No cough, sneeze.   No diaphoresis, nausea.  No radiation into arm.  Chronic pain in L arm.  Tightness in back.  No leg swelling.  Bad varicose veins.  Walking for exercise; ;unable to walk long distances; will walk one mile but must push self to walk one mile.  Previously walked two miles but SOB is limiting.  Gained weight in past eight months.  Obesity:  Gaining weight; has always carried weight.  Family history of obesity.  Would love medication for obesity.  Cannot decide to lose weight; comfort eats.  R leg pain:  If sits down a lot; worse with driving; R lateral proximal thigh.  Sharp pain in R lateral thigh.  Numbness.  No nighttime awakening; no back pain.  Knee giving out on R; weak ankles.    Rash posterior scalp:  Very itchy.  No medications or shampoos.    Anemia:  Found at last visit; never returned for repeat labs.  Feeling well. Denies bloody stools, black stools, n/v/d/c.  No abdominal pain.   Hypothyroidism: eight month follow-up; no changes to management made at  last visit.  Reports compliance with medication; good tolerance to medication; good symptom control. Needs refill.  Depression: stable.  Continues to struggle with marital strain; husband has stopped all medications; he wants to die.  Patient goes on with her life; husband does not want help.  Feels Zoloft is at appropriate dose.  Needs refill.  Insomnia:  Stable with Trazodone 50mg  qhs, Xanax 1/2 qhs; needs refills.  Hypercholesterolemia: due for repeat labs; has gained weight in past eight months.       Review of Systems  Constitutional: Positive for activity change. Negative for fever, chills, diaphoresis, appetite change, fatigue and unexpected weight change.  HENT: Negative for hearing loss, ear pain, nosebleeds, congestion, sore throat, facial swelling, rhinorrhea, sneezing, drooling, mouth sores, trouble swallowing, neck pain, neck stiffness, dental problem, voice change, postnasal drip, sinus pressure, tinnitus and ear discharge.   Eyes: Negative for photophobia, pain, discharge, redness, itching and visual disturbance.  Respiratory: Positive for chest tightness and shortness of breath. Negative for apnea, cough, choking, wheezing and stridor.   Cardiovascular: Positive for chest pain. Negative for palpitations and leg swelling.  Gastrointestinal: Negative for nausea, vomiting, abdominal pain, diarrhea, constipation, blood in stool, abdominal distention, anal bleeding and rectal pain.  Endocrine: Negative for cold intolerance, heat intolerance, polydipsia, polyphagia and polyuria.  Genitourinary: Negative for dysuria, urgency, frequency, hematuria, flank pain, decreased urine volume, vaginal bleeding, vaginal discharge, enuresis, difficulty urinating,  genital sores, pelvic pain and dyspareunia.  Musculoskeletal: Positive for myalgias and arthralgias. Negative for back pain, joint swelling and gait problem.  Skin: Positive for rash. Negative for color change, pallor and wound.    Allergic/Immunologic: Negative for environmental allergies, food allergies and immunocompromised state.  Neurological: Positive for numbness. Negative for dizziness, tremors, seizures, syncope, facial asymmetry, speech difficulty, weakness, light-headedness and headaches.  Hematological: Negative for adenopathy. Does not bruise/bleed easily.  Psychiatric/Behavioral: Positive for sleep disturbance and dysphoric mood. Negative for suicidal ideas, hallucinations, behavioral problems, confusion, self-injury, decreased concentration and agitation. The patient is nervous/anxious. The patient is not hyperactive.         Past Medical History  Diagnosis Date  . Pure hypercholesterolemia   . Chest pain, unspecified   . Postmenopausal bleeding   . Abdominal pain, unspecified site   . Chest pain, unspecified   . Anxiety state, unspecified   . Depressive disorder, not elsewhere classified   . Unspecified hypothyroidism   . Insomnia, unspecified     Past Surgical History  Procedure Laterality Date  . Cesarean section      x 2  . Dilation and curettage of uterus    . Arm surgery      x 4 post accident at work, with skin graft, [facility]  . Abdominal hysterectomy  04/16/2012    one ovary resected.  No malignancy.  Post-menopausal bleeding.  [facility].  . Colonoscopy  08/16/2009    normal.  [facility].  Repeat in 10 years.    Prior to Admission medications   Medication Sig Start Date End Date Taking? Authorizing Provider  ALPRAZolam Prudy Feeler) 0.5 MG tablet Take 1 tablet (0.5 mg total) by mouth at bedtime as needed. 11/14/12  Yes Ethelda Chick, MD  aspirin 81 MG tablet Take 81 mg by mouth daily.   Yes Historical Provider, MD  Fiber TABS 500 mg. Take one daily.   Yes Historical Provider, MD  fish oil-omega-3 fatty acids 1000 MG capsule Take 2 g by mouth daily.   Yes Historical Provider, MD  levothyroxine (SYNTHROID, LEVOTHROID) 88 MCG tablet Take 1 tablet (88 mcg total) by mouth daily. 11/14/12   Yes Ethelda Chick, MD  sertraline (ZOLOFT) 100 MG tablet Take 1 tablet (100 mg total) by mouth daily. Takes 1.5 tabs daily 11/14/12  Yes Ethelda Chick, MD  traZODone (DESYREL) 50 MG tablet Take 1 tablet (50 mg total) by mouth at bedtime as needed. 11/14/12  Yes Ethelda Chick, MD  naproxen (NAPROSYN) 500 MG tablet Take 1 tablet (500 mg total) by mouth 2 (two) times daily with a meal. FOR LEG PAIN 11/14/12   Ethelda Chick, MD  triamcinolone cream (KENALOG) 0.1 % Apply topically 2 (two) times daily. 11/14/12   Ethelda Chick, MD    Allergies  Allergen Reactions  . Cephalexin Rash    History   Social History  . Marital Status: Unknown    Spouse Name: N/A    Number of Children: 2  . Years of Education: 12   Occupational History  . disabled     due to arm surgery   Social History Main Topics  . Smoking status: Never Smoker   . Smokeless tobacco: Not on file  . Alcohol Use: No  . Drug Use: No  . Sexually Active: Not on file   Other Topics Concern  . Not on file   Social History Narrative   Sexual activity:  Not sexually active.  Seatbelt:  Always uses seat belts.       Smoke alarm and carbon monoxide detector in the home.       Guns: Has no unsecured guns in the home.       Caffeine:  Does not consume any caffeine.       Exercise: walks daily for 30 minutes walks 1-2 miles about every other day. Has dog.       Marital status:  Married x 41 years; moderately happy; married; no abuse;       Children:  2 daughters, 3 grandchildren/sons.       Organ donor: Yes.       Living Will: Patient does not have living will, DOES have HCPOA.      Employment: disability 2010 for [employer] comp injury arm.    Family History  Problem Relation Age of Onset  . Heart disease Brother     from drug abuse  . Alcohol abuse Brother   . Cirrhosis Brother   . COPD Mother   . Heart disease Mother     CHF  . Cancer Father     lung, prostate  . Heart disease Father 62    CABG  .  Hyperlipidemia Father   . Ulcers Brother      Objective:   Physical Exam  Nursing note and vitals reviewed. Constitutional: She appears well-developed and well-nourished. No distress.  HENT:  Head: Normocephalic and atraumatic.  Right Ear: External ear normal.  Left Ear: External ear normal.  Nose: Nose normal.  Mouth/Throat: Oropharynx is clear and moist.  Eyes: Conjunctivae and EOM are normal. Pupils are equal, round, and reactive to light.  Neck: Normal range of motion. Neck supple. No JVD present. No tracheal deviation present. No thyromegaly present.  Cardiovascular: Normal rate, regular rhythm, normal heart sounds and intact distal pulses.  Exam reveals no gallop and no friction rub.   No murmur heard. Varicosities and superficial vessels in B legs.  Pulmonary/Chest: Effort normal and breath sounds normal. No respiratory distress. She has no wheezes. She has no rales.  Abdominal: Soft. Bowel sounds are normal. She exhibits no distension. There is no tenderness. There is no rebound and no guarding.  Genitourinary: No breast swelling, tenderness, discharge or bleeding.  Musculoskeletal:       Right shoulder: Normal.       Left shoulder: Normal.       Right knee: Normal.       Cervical back: Normal.       Right upper leg: She exhibits tenderness.  R thigh: TTP lateral thigh.  Lymphadenopathy:    She has no cervical adenopathy.  Skin: She is not diaphoretic.       Assessment & Plan:  Routine general medical examination at a health care facility - Plan: POCT urinalysis dipstick, Lipid panel, EKG 12-Lead, POCT UA - Microscopic Only  Unspecified hypothyroidism - Plan: TSH, T4, free, Lipid panel, levothyroxine (SYNTHROID, LEVOTHROID) 100 MCG tablet, DISCONTINUED: levothyroxine (SYNTHROID, LEVOTHROID) 88 MCG tablet  Anemia, unspecified - Plan: CBC with Differential, Lipid panel  Other abnormal glucose - Plan: Hemoglobin A1c, POCT glucose (manual entry), Lipid panel  Chest  pain - Plan: DG Chest 2 View, Ambulatory referral to Cardiology  Dyspnea on exertion - Plan: DG Chest 2 View  Seborrhea capitis - Plan: triamcinolone cream (KENALOG) 0.1 %  Pure hypercholesterolemia  Anxiety and depression - Plan: ALPRAZolam (XANAX) 0.5 MG tablet  Insomnia - Plan: ALPRAZolam (XANAX) 0.5 MG tablet, traZODone (DESYREL) 50  MG tablet  Depression - Plan: sertraline (ZOLOFT) 100 MG tablet  Breast cancer screening - Plan: MM Digital Screening  IT band syndrome, right - Plan: DISCONTINUED: naproxen (NAPROSYN) 500 MG tablet

## 2012-11-16 MED ORDER — LEVOTHYROXINE SODIUM 100 MCG PO TABS: 100.0000 ug | ORAL_TABLET | Freq: Every day | ORAL | Status: DC

## 2012-11-24 ENCOUNTER — Ambulatory Visit (INDEPENDENT_AMBULATORY_CARE_PROVIDER_SITE_OTHER): Payer: Medicare Other | Admitting: Cardiovascular Disease

## 2012-11-24 ENCOUNTER — Encounter: Payer: Self-pay | Admitting: Cardiovascular Disease

## 2012-11-24 VITALS — BP 140/60 | HR 74 | Ht 63.0 in | Wt 242.5 lb

## 2012-11-24 VITALS — BP 158/72 | HR 75 | Ht 63.0 in | Wt 242.5 lb

## 2012-11-24 DIAGNOSIS — R0602 Shortness of breath: Secondary | ICD-10-CM | POA: Insufficient documentation

## 2012-11-24 DIAGNOSIS — M79609 Pain in unspecified limb: Secondary | ICD-10-CM

## 2012-11-24 DIAGNOSIS — R079 Chest pain, unspecified: Secondary | ICD-10-CM

## 2012-11-24 DIAGNOSIS — R011 Cardiac murmur, unspecified: Secondary | ICD-10-CM | POA: Diagnosis not present

## 2012-11-24 DIAGNOSIS — M79602 Pain in left arm: Secondary | ICD-10-CM

## 2012-11-24 NOTE — Patient Instructions (Addendum)
Your stress test is normal.  Schedule echocardiogram.

## 2012-11-24 NOTE — Patient Instructions (Addendum)
Your physician has requested that you have an exercise tolerance test. For further information please visit [email or web address]. Please also follow instruction sheet, as given.  Your physician has requested that you have an echocardiogram. Echocardiography is a painless test that uses sound waves to create images of your heart. It provides your doctor with information about the size and shape of your heart and how well your heart's chambers and valves are working. This procedure takes approximately one hour. There are no restrictions for this procedure.   

## 2012-11-24 NOTE — Assessment & Plan Note (Signed)
She has significant dyspnea and thus I will obtain an echocardiogram to evaluate LV systolic function and other cardiac structures. She also has a faint cardiac murmur at the aortic area which could be due to aortic sclerosis.

## 2012-11-24 NOTE — Procedures (Signed)
    Treadmill Stress test  Indication: Exertional chest pain.  Baseline Data:  Resting EKG shows NSR with rate of 73 bpm, no significant ST or T wave changes. Resting blood pressure of 140/60 mm Hg Stand bruce protocal was used.  Exercise Data:  Patient exercised for 6 min 37 sec,  Peak heart rate of 136 bpm.  This was 86 % of the maximum predicted heart rate. She complained of mild substernal chest tightness which was non-limiting. Peak Blood pressure recorded was 200/60 Maximal work level: 7 METs.  Heart rate at 3 minutes in recovery was 99 bpm. BP response: Hypertensive HR response: Normal  EKG with Exercise: Sinus tachycardia with no significant ST changes. Few PACs were noted in recovery.  FINAL IMPRESSION: Normal exercise stress test. No significant EKG changes concerning for ischemia. Fair exercise tolerance with hypertensive response to exercise.  Recommendation: Proceed with an echocardiogram to evaluate LV systolic function.

## 2012-11-24 NOTE — Progress Notes (Signed)
Primary care physician: Dr. Nilda Simmer  HPI  This is a pleasant 62 year old female who is referred for evaluation of exertional dyspnea and shortness of breath. She has no previous cardiac history. She was evaluated a year ago at [facility] clinic with a stress test which was negative. She has no significant risk factors for coronary artery disease except for obesity. There is family history of coronary artery disease but not prematurely. Her father had CABG at the age of 75. She is a lifelong nonsmoker. Over the last few months, she has been having substernal chest tightness mainly if she goes uphill and in the cold weather. The tightness is usually not severe and does not prevent her from continuing her activities. It is associated with shortness of breath. She does not get this discomfort at rest. He denies any palpitations, syncope or presyncope. She also gets left arm numbness but that does not seem to be related to the chest pain. She had a previous injury to the left arm.  Allergies  Allergen Reactions  . Cephalexin Rash     Current Outpatient Prescriptions on File Prior to Visit  Medication Sig Dispense Refill  . ALPRAZolam (XANAX) 0.5 MG tablet Take 1 tablet (0.5 mg total) by mouth at bedtime as needed.  30 tablet  5  . aspirin 81 MG tablet Take 81 mg by mouth daily.      . Fiber TABS 500 mg. Take one daily.      . fish oil-omega-3 fatty acids 1000 MG capsule Take 2 g by mouth daily.      Marland Kitchen levothyroxine (SYNTHROID, LEVOTHROID) 100 MCG tablet Take 1 tablet (100 mcg total) by mouth daily.  30 tablet  11  . sertraline (ZOLOFT) 100 MG tablet Take 1 tablet (100 mg total) by mouth daily. Takes 1.5 tabs daily  45 tablet  5  . traZODone (DESYREL) 50 MG tablet Take 1 tablet (50 mg total) by mouth at bedtime as needed.  30 tablet  5  . triamcinolone cream (KENALOG) 0.1 % Apply topically 2 (two) times daily.  30 g  0   No current facility-administered medications on file prior to visit.      Past Medical History  Diagnosis Date  . Pure hypercholesterolemia   . Chest pain, unspecified   . Postmenopausal bleeding   . Abdominal pain, unspecified site   . Chest pain, unspecified   . Anxiety state, unspecified   . Depressive disorder, not elsewhere classified   . Unspecified hypothyroidism   . Insomnia, unspecified   . Heart murmur     hx     Past Surgical History  Procedure Laterality Date  . Cesarean section      x 2  . Dilation and curettage of uterus    . Arm surgery      x 4 post accident at work, with skin graft, [facility]  . Abdominal hysterectomy  04/16/2012    one ovary resected.  No malignancy.  Post-menopausal bleeding.  [facility].  . Colonoscopy  08/16/2009    normal.  [facility].  Repeat in 10 years.     Family History  Problem Relation Age of Onset  . Heart disease Brother     from drug abuse  . Alcohol abuse Brother   . Cirrhosis Brother   . COPD Mother   . Heart disease Mother     CHF  . Heart failure Mother   . Cancer Father     lung, prostate  .  Hyperlipidemia Father   . Heart disease Father 62    CABG  . Ulcers Brother      History   Social History  . Marital Status: Unknown    Spouse Name: N/A    Number of Children: 2  . Years of Education: 12   Occupational History  . disabled     due to arm surgery   Social History Main Topics  . Smoking status: Never Smoker   . Smokeless tobacco: Not on file  . Alcohol Use: No  . Drug Use: No  . Sexually Active: Not on file   Other Topics Concern  . Not on file   Social History Narrative   Sexual activity:  Not sexually active.       Seatbelt:  Always uses seat belts.       Smoke alarm and carbon monoxide detector in the home.       Guns: Has no unsecured guns in the home.       Caffeine:  Does not consume any caffeine.       Exercise: walks daily for 30 minutes walks 1-2 miles about every other day. Has dog.       Marital status:  Married x 41 years; moderately  happy; married; no abuse;       Children:  2 daughters, 3 grandchildren/sons.       Organ donor: Yes.       Living Will: Patient does not have living will, DOES have HCPOA.      Employment: disability 2010 for [employer] comp injury arm.     ROS Constitutional: Negative for fever, chills, diaphoresis, activity change, appetite change and fatigue.  HENT: Negative for hearing loss, nosebleeds, congestion, sore throat, facial swelling, drooling, trouble swallowing, neck pain, voice change, sinus pressure and tinnitus.  Eyes: Negative for photophobia, pain, discharge and visual disturbance.  Respiratory: Negative for apnea, cough and wheezing.  Cardiovascular: Negative for palpitations and leg swelling.  Gastrointestinal: Negative for nausea, vomiting, abdominal pain, diarrhea, constipation, blood in stool and abdominal distention.  Genitourinary: Negative for dysuria, urgency, frequency, hematuria and decreased urine volume.  Musculoskeletal: Negative for myalgias, back pain, joint swelling, arthralgias and gait problem.  Skin: Negative for color change, pallor, rash and wound.  Neurological: Negative for dizziness, tremors, seizures, syncope, speech difficulty, weakness, light-headedness, numbness and headaches.  Psychiatric/Behavioral: Negative for suicidal ideas, hallucinations, behavioral problems and agitation. The patient is not nervous/anxious.     PHYSICAL EXAM   BP 158/72  Pulse 75  Ht 5\' 3"  (1.6 m)  Wt 242 lb 8 oz (109.997 kg)  BMI 42.97 kg/m2 Constitutional: She is oriented to person, place, and time. She appears well-developed and well-nourished. No distress.  HENT: No nasal discharge.  Head: Normocephalic and atraumatic.  Eyes: Pupils are equal and round. Right eye exhibits no discharge. Left eye exhibits no discharge.  Neck: Normal range of motion. Neck supple. No JVD present. No thyromegaly present.  Cardiovascular: Normal rate, regular rhythm, normal heart sounds. Exam  reveals no gallop and no friction rub. There is a 1/6 systolic ejection murmur at the aortic area  Pulmonary/Chest: Effort normal and breath sounds normal. No stridor. No respiratory distress. She has no wheezes. She has no rales. She exhibits no tenderness.  Abdominal: Soft. Bowel sounds are normal. She exhibits no distension. There is no tenderness. There is no rebound and no guarding.  Musculoskeletal: Normal range of motion. She exhibits no edema and no tenderness.  Neurological: She is  alert and oriented to person, place, and time. Coordination normal.  Skin: Skin is warm and dry. No rash noted. She is not diaphoretic. No erythema. No pallor.  Psychiatric: She has a normal mood and affect. Her behavior is normal. Judgment and thought content normal.     [id number]  Rhythm  WITHIN NORMAL LIMITS   ASSESSMENT AND PLAN

## 2012-11-24 NOTE — Assessment & Plan Note (Signed)
The patient is having mild exertional chest pain with dyspnea. Thus, I decided to proceed with a treadmill stress test which showed no evidence of ischemia. She did have mild nonlimiting chest discomfort with exercise without any associated ST changes. She did have frequent PACs in recovery and there was hypertensive response to exercise. I suspect that she has physical deconditioning contributing to her symptoms. Her blood pressure will need to be monitored closely and treated if needed. I advised her to start an exercise program and attempt weight loss.

## 2012-12-14 ENCOUNTER — Other Ambulatory Visit: Payer: Self-pay

## 2012-12-14 ENCOUNTER — Other Ambulatory Visit (INDEPENDENT_AMBULATORY_CARE_PROVIDER_SITE_OTHER): Payer: Medicare Other

## 2012-12-14 DIAGNOSIS — R011 Cardiac murmur, unspecified: Secondary | ICD-10-CM

## 2012-12-14 DIAGNOSIS — R0602 Shortness of breath: Secondary | ICD-10-CM | POA: Diagnosis not present

## 2012-12-17 DIAGNOSIS — E7800 Pure hypercholesterolemia, unspecified: Secondary | ICD-10-CM | POA: Insufficient documentation

## 2012-12-17 DIAGNOSIS — Z1239 Encounter for other screening for malignant neoplasm of breast: Secondary | ICD-10-CM | POA: Insufficient documentation

## 2012-12-17 DIAGNOSIS — D649 Anemia, unspecified: Secondary | ICD-10-CM | POA: Insufficient documentation

## 2012-12-17 DIAGNOSIS — M7630 Iliotibial band syndrome, unspecified leg: Secondary | ICD-10-CM | POA: Insufficient documentation

## 2012-12-17 DIAGNOSIS — R0609 Other forms of dyspnea: Secondary | ICD-10-CM | POA: Insufficient documentation

## 2012-12-17 DIAGNOSIS — F329 Major depressive disorder, single episode, unspecified: Secondary | ICD-10-CM | POA: Insufficient documentation

## 2012-12-17 DIAGNOSIS — L210 Seborrhea capitis: Secondary | ICD-10-CM | POA: Insufficient documentation

## 2012-12-17 DIAGNOSIS — R7309 Other abnormal glucose: Secondary | ICD-10-CM | POA: Insufficient documentation

## 2012-12-17 DIAGNOSIS — Z0000 Encounter for general adult medical examination without abnormal findings: Secondary | ICD-10-CM | POA: Insufficient documentation

## 2012-12-17 NOTE — Assessment & Plan Note (Signed)
Controlled; obtain labs; refills provided.

## 2012-12-17 NOTE — Assessment & Plan Note (Signed)
Stable; obtain labs; recommend weight loss, dietary modification, exercise.

## 2012-12-17 NOTE — Assessment & Plan Note (Signed)
New at last visit; repeat labs today; asymptomatic.  Colonoscopy UTD.

## 2012-12-17 NOTE — Assessment & Plan Note (Signed)
Stable; refills provided.  Coping well with current marital strain.

## 2012-12-17 NOTE — Assessment & Plan Note (Signed)
New.  Home exercises provided; rx for Naproxen provided. If no improvement in 2-4 weeks, call for ortho referral.

## 2012-12-17 NOTE — Assessment & Plan Note (Signed)
Refer for mammogram.  

## 2012-12-17 NOTE — Assessment & Plan Note (Signed)
Persistent; repeat labs; recommend weight loss, exercise, low-cholesterol food choices.

## 2012-12-17 NOTE — Assessment & Plan Note (Signed)
Anticipatory guidance provided --- weight loss, exercise.  Pap smear UTD.  Refer for mammogram.  Colonoscopy UTD.  Immunizations discussed; advised to call insurance regarding Zostavax coverage.

## 2012-12-17 NOTE — Assessment & Plan Note (Signed)
Worsening; associated with chest pain; obtain CXR and EKG; refer to cardiology.  If cardiac work up negative, consider referral to pulmonology.

## 2012-12-17 NOTE — Assessment & Plan Note (Addendum)
Persistent; obtain CXR and EKG; exertional in nature and associated with DOE;  refer to cardiology.

## 2012-12-17 NOTE — Assessment & Plan Note (Signed)
Persistent; refill of medications provided.

## 2012-12-17 NOTE — Assessment & Plan Note (Addendum)
New.  Recommend dandruff shampoo twice weekly; rx for Triamcinolone provided to use as needed.

## 2012-12-25 ENCOUNTER — Encounter: Payer: Self-pay | Admitting: Cardiovascular Disease

## 2012-12-25 ENCOUNTER — Ambulatory Visit (INDEPENDENT_AMBULATORY_CARE_PROVIDER_SITE_OTHER): Payer: Medicare Other | Admitting: Cardiovascular Disease

## 2012-12-25 VITALS — BP 158/74 | HR 73 | Ht 63.0 in | Wt 241.2 lb

## 2012-12-25 DIAGNOSIS — R0609 Other forms of dyspnea: Secondary | ICD-10-CM

## 2012-12-25 DIAGNOSIS — R0989 Other specified symptoms and signs involving the circulatory and respiratory systems: Secondary | ICD-10-CM | POA: Diagnosis not present

## 2012-12-25 DIAGNOSIS — R079 Chest pain, unspecified: Secondary | ICD-10-CM | POA: Diagnosis not present

## 2012-12-25 DIAGNOSIS — I10 Essential (primary) hypertension: Secondary | ICD-10-CM | POA: Diagnosis not present

## 2012-12-25 MED ORDER — LOSARTAN POTASSIUM 25 MG PO TABS: 25.0000 mg | ORAL_TABLET | Freq: Every day | ORAL | Status: DC

## 2012-12-25 NOTE — Progress Notes (Signed)
Primary care physician: Dr. Nilda Simmer  HPI  This is a pleasant 62 year old female who is here today for a followup visit regarding  exertional dyspnea and chest tightness. She has no previous cardiac history. She has no significant risk factors for coronary artery disease except for obesity. There is family history of coronary artery disease but not prematurely. Her father had CABG at the age of 61. She is a lifelong nonsmoker. During last visit, she underwent a treadmill stress test which overall showed no evidence of ischemia. There was hypertensive response to exercise with associated dyspnea but no chest discomfort. She underwent an echocardiogram which showed normal LV systolic function with moderate left ventricular hypertrophy, grade 1 diastolic dysfunction and possible small ASD with a tiny left-to-right shunt. She reports that her chest pain has resolved. Her dyspnea is improving with exercise.   Allergies  Allergen Reactions  . Cephalexin Rash     Current Outpatient Prescriptions on File Prior to Visit  Medication Sig Dispense Refill  . ALPRAZolam (XANAX) 0.5 MG tablet Take 1 tablet (0.5 mg total) by mouth at bedtime as needed.  30 tablet  5  . aspirin 81 MG tablet Take 81 mg by mouth daily.      . Fiber TABS 500 mg. Take one daily.      . fish oil-omega-3 fatty acids 1000 MG capsule Take 2 g by mouth daily.      Marland Kitchen levothyroxine (SYNTHROID, LEVOTHROID) 100 MCG tablet Take 1 tablet (100 mcg total) by mouth daily.  30 tablet  11  . sertraline (ZOLOFT) 100 MG tablet Take 1 tablet (100 mg total) by mouth daily. Takes 1.5 tabs daily  45 tablet  5  . traZODone (DESYREL) 50 MG tablet Take 1 tablet (50 mg total) by mouth at bedtime as needed.  30 tablet  5  . triamcinolone cream (KENALOG) 0.1 % Apply topically 2 (two) times daily.  30 g  0   No current facility-administered medications on file prior to visit.     Past Medical History  Diagnosis Date  . Pure  hypercholesterolemia   . Chest pain, unspecified   . Postmenopausal bleeding   . Abdominal pain, unspecified site   . Chest pain, unspecified   . Anxiety state, unspecified   . Depressive disorder, not elsewhere classified   . Unspecified hypothyroidism   . Insomnia, unspecified   . Heart murmur     hx     Past Surgical History  Procedure Laterality Date  . Cesarean section      x 2  . Dilation and curettage of uterus    . Arm surgery      x 4 post accident at work, with skin graft, [facility]  . Abdominal hysterectomy  04/16/2012    one ovary resected.  No malignancy.  Post-menopausal bleeding.  [facility].  . Colonoscopy  08/16/2009    normal.  [facility].  Repeat in 10 years.     Family History  Problem Relation Age of Onset  . Heart disease Brother     from drug abuse  . Alcohol abuse Brother   . Cirrhosis Brother   . COPD Mother   . Heart disease Mother     CHF  . Heart failure Mother   . Cancer Father     lung, prostate  . Hyperlipidemia Father   . Heart disease Father 31    CABG  . Ulcers Brother      History   Social  History  . Marital Status: Unknown    Spouse Name: N/A    Number of Children: 2  . Years of Education: 12   Occupational History  . disabled     due to arm surgery   Social History Main Topics  . Smoking status: Never Smoker   . Smokeless tobacco: Not on file  . Alcohol Use: No  . Drug Use: No  . Sexually Active: Not on file   Other Topics Concern  . Not on file   Social History Narrative   Sexual activity:  Not sexually active.       Seatbelt:  Always uses seat belts.       Smoke alarm and carbon monoxide detector in the home.       Guns: Has no unsecured guns in the home.       Caffeine:  Does not consume any caffeine.       Exercise: walks daily for 30 minutes walks 1-2 miles about every other day. Has dog.       Marital status:  Married x 41 years; moderately happy; married; no abuse;       Children:  2 daughters, 3  grandchildren/sons.       Organ donor: Yes.       Living Will: Patient does not have living will, DOES have HCPOA.      Employment: disability 2010 for [employer] comp injury arm.      PHYSICAL EXAM   BP 158/74  Pulse 73  Ht 5\' 3"  (1.6 m)  Wt 241 lb 4 oz (109.43 kg)  BMI 42.75 kg/m2 Constitutional: She is oriented to person, place, and time. She appears well-developed and well-nourished. No distress.  HENT: No nasal discharge.  Head: Normocephalic and atraumatic.  Eyes: Pupils are equal and round. Right eye exhibits no discharge. Left eye exhibits no discharge.  Neck: Normal range of motion. Neck supple. No JVD present. No thyromegaly present.  Cardiovascular: Normal rate, regular rhythm, normal heart sounds. Exam reveals no gallop and no friction rub. There is a 1/6 systolic ejection murmur at the aortic area  Pulmonary/Chest: Effort normal and breath sounds normal. No stridor. No respiratory distress. She has no wheezes. She has no rales. She exhibits no tenderness.  Abdominal: Soft. Bowel sounds are normal. She exhibits no distension. There is no tenderness. There is no rebound and no guarding.  Musculoskeletal: Normal range of motion. She exhibits no edema and no tenderness.  Neurological: She is alert and oriented to person, place, and time. Coordination normal.  Skin: Skin is warm and dry. No rash noted. She is not diaphoretic. No erythema. No pallor.  Psychiatric: She has a normal mood and affect. Her behavior is normal. Judgment and thought content normal.     ASSESSMENT AND PLAN

## 2012-12-25 NOTE — Patient Instructions (Addendum)
Start Losartan 25 mg once daily for high blood pressure.  Check labs in 1 week.  Follow up in 6 months.

## 2012-12-26 ENCOUNTER — Encounter: Payer: Self-pay | Admitting: Cardiovascular Disease

## 2012-12-26 DIAGNOSIS — I10 Essential (primary) hypertension: Secondary | ICD-10-CM | POA: Insufficient documentation

## 2012-12-26 NOTE — Assessment & Plan Note (Signed)
She has been noted to be hypertensive in more than one occasion. She also had hypertensive response to exercise during her treadmill stress test. Echocardiogram showed evidence of moderate left ventricular hypertrophy. Due to that, I recommend medical treatment and will initiate losartan 25 mg once daily and will check basic metabolic profile in one week.

## 2012-12-26 NOTE — Assessment & Plan Note (Addendum)
I suspect that this is multifactorial likely due to physical deconditioning and mild diastolic heart failure. Echocardiogram showed normal LV systolic function but there was moderate concentric hypertrophy and grade 1 diastolic dysfunction. I advised her to continue with exercise as tolerated. Diet issue of the possibility of a small ASD with minor left to right shunting. The pulmonary pressure was normal. Both right atrium and ventricle were normal in size. This is likely not causing any of her symptoms should be monitored. I will plan on repeating an echocardiogram in one year.

## 2012-12-26 NOTE — Assessment & Plan Note (Signed)
No further chest pain. Recent treadmill stress test was negative for ischemia.

## 2013-01-01 ENCOUNTER — Ambulatory Visit (INDEPENDENT_AMBULATORY_CARE_PROVIDER_SITE_OTHER): Payer: Medicare Other

## 2013-01-01 DIAGNOSIS — I10 Essential (primary) hypertension: Secondary | ICD-10-CM | POA: Diagnosis not present

## 2013-01-02 LAB — BASIC METABOLIC PANEL
BUN/Creatinine Ratio: 18 (ref 11–26)
BUN: 18 mg/dL (ref 8–27)
Chloride: 106 mmol/L (ref 97–108)
GFR calc Af Amer: 71 mL/min/{1.73_m2} (ref >59)
GFR calc non Af Amer: 62 mL/min/{1.73_m2} (ref >59)

## 2013-01-05 NOTE — Progress Notes (Signed)
Pt informed of normal labs.

## 2013-01-17 IMAGING — CR DG CHEST 2V
1 series · 2 of 2 positions shown · non-contrast
Comparison: none

REASON FOR EXAM: shortness of breath
COMMENTS:

PROCEDURE:     KDR - KDXR CHEST PA (OR AP) AND LAT  - February 01, 2012  [DATE]
RESULT:     The lung fields are clear. The heart, mediastinal and osseous
structures show no significant abnormalities.

[Series 1: pa · 0.17mm/px · 2 of 2 slices shown]
[im 1/2]
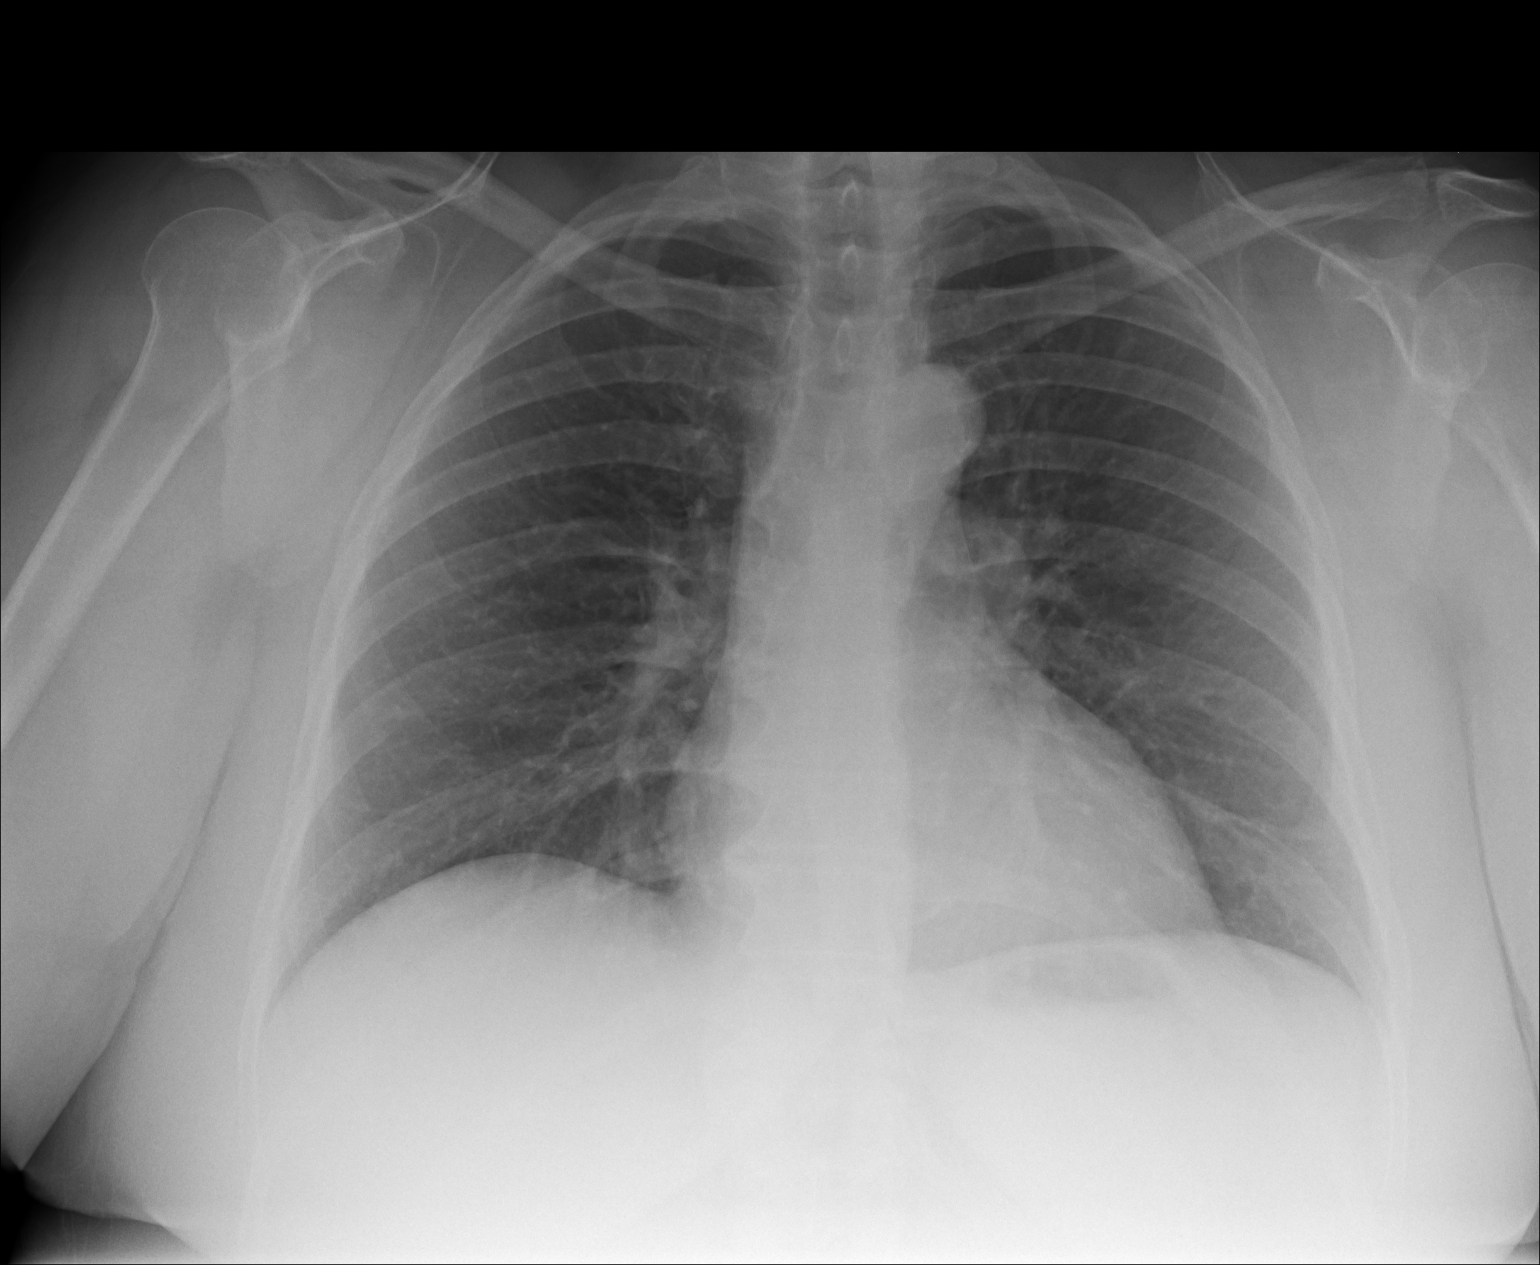
[im 2/2]
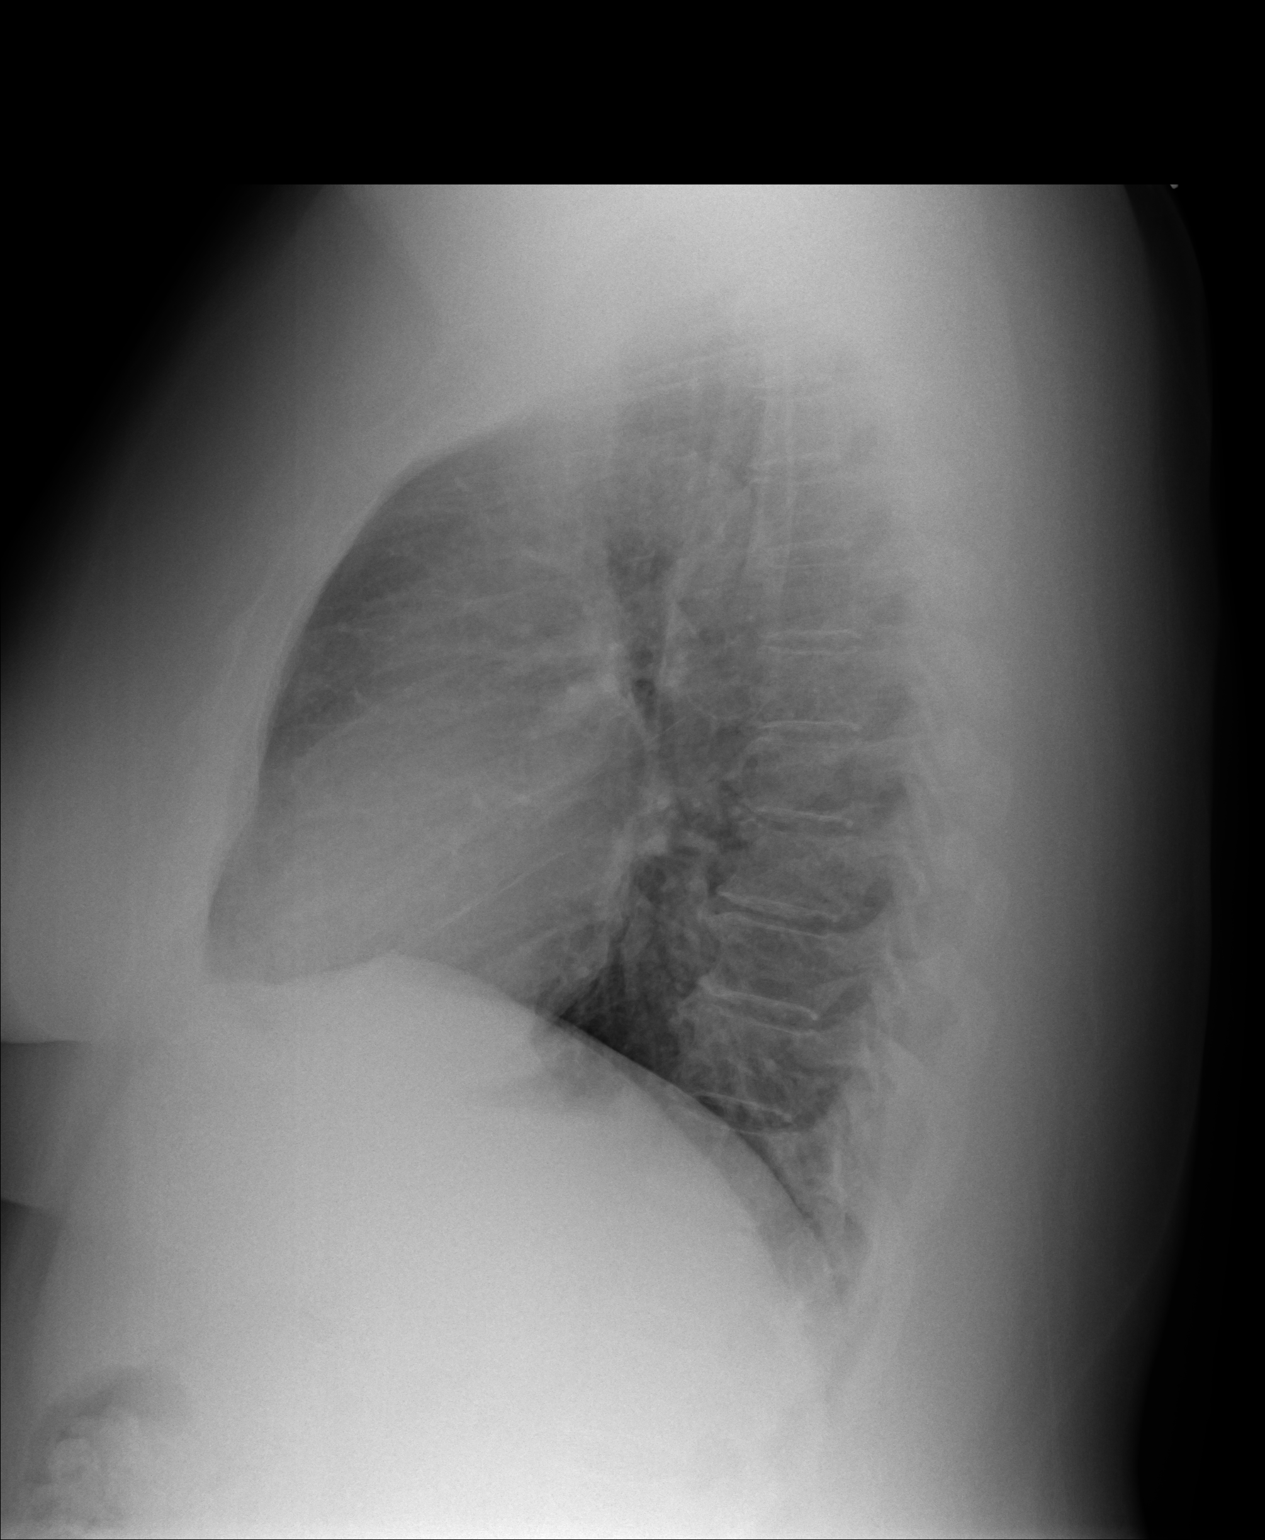

[2 of 2 positions shown; findings below may reference images not displayed]

IMPRESSION: No acute changes are identified.

[REDACTED]

## 2013-02-05 ENCOUNTER — Ambulatory Visit: Payer: Medicare Other | Admitting: Family Medicine

## 2013-02-06 ENCOUNTER — Ambulatory Visit: Payer: Self-pay | Admitting: Family Medicine

## 2013-02-06 DIAGNOSIS — Z1231 Encounter for screening mammogram for malignant neoplasm of breast: Secondary | ICD-10-CM | POA: Diagnosis not present

## 2013-02-07 ENCOUNTER — Encounter: Payer: Self-pay | Admitting: Radiology

## 2013-02-07 ENCOUNTER — Telehealth: Payer: Self-pay | Admitting: Radiology

## 2013-02-07 NOTE — Telephone Encounter (Signed)
Patient advised mammogram normal 

## 2013-05-28 DIAGNOSIS — Z23 Encounter for immunization: Secondary | ICD-10-CM | POA: Diagnosis not present

## 2013-05-29 ENCOUNTER — Other Ambulatory Visit: Payer: Self-pay

## 2013-05-29 DIAGNOSIS — F329 Major depressive disorder, single episode, unspecified: Secondary | ICD-10-CM

## 2013-05-29 MED ORDER — SERTRALINE HCL 100 MG PO TABS: ORAL_TABLET | ORAL | Status: DC

## 2013-06-20 ENCOUNTER — Ambulatory Visit (INDEPENDENT_AMBULATORY_CARE_PROVIDER_SITE_OTHER): Payer: Medicare Other | Admitting: Family Medicine

## 2013-06-20 ENCOUNTER — Encounter: Payer: Self-pay | Admitting: Family Medicine

## 2013-06-20 VITALS — BP 138/72 | HR 66 | Temp 98.0°F | Resp 16 | Ht 63.0 in | Wt 245.2 lb

## 2013-06-20 DIAGNOSIS — F329 Major depressive disorder, single episode, unspecified: Secondary | ICD-10-CM

## 2013-06-20 DIAGNOSIS — E039 Hypothyroidism, unspecified: Secondary | ICD-10-CM | POA: Diagnosis not present

## 2013-06-20 DIAGNOSIS — D649 Anemia, unspecified: Secondary | ICD-10-CM

## 2013-06-20 DIAGNOSIS — I10 Essential (primary) hypertension: Secondary | ICD-10-CM

## 2013-06-20 DIAGNOSIS — R7309 Other abnormal glucose: Secondary | ICD-10-CM

## 2013-06-20 DIAGNOSIS — G4700 Insomnia, unspecified: Secondary | ICD-10-CM

## 2013-06-20 DIAGNOSIS — F341 Dysthymic disorder: Secondary | ICD-10-CM | POA: Diagnosis not present

## 2013-06-20 DIAGNOSIS — E7800 Pure hypercholesterolemia, unspecified: Secondary | ICD-10-CM

## 2013-06-20 LAB — COMPREHENSIVE METABOLIC PANEL
ALT: 12 U/L (ref 0–35)
AST: 16 U/L (ref 0–37)
Albumin: 4.3 g/dL (ref 3.5–5.2)
Alkaline Phosphatase: 62 U/L (ref 39–117)
BUN: 15 mg/dL (ref 6–23)
Calcium: 9.6 mg/dL (ref 8.4–10.5)
Chloride: 104 mEq/L (ref 96–112)
Glucose, Bld: 90 mg/dL (ref 70–99)
Potassium: 3.9 mEq/L (ref 3.5–5.3)
Sodium: 139 mEq/L (ref 135–145)
Total Protein: 6.7 g/dL (ref 6.0–8.3)

## 2013-06-20 LAB — LIPID PANEL
Cholesterol: 231 mg/dL — ABNORMAL HIGH (ref 0–200)
HDL: 44 mg/dL (ref >39)
Total CHOL/HDL Ratio: 5.3 Ratio
Triglycerides: 102 mg/dL (ref <150)
VLDL: 20 mg/dL (ref 0–40)

## 2013-06-20 LAB — CBC WITH DIFFERENTIAL/PLATELET
Basophils Absolute: 0 10*3/uL (ref 0.0–0.1)
Basophils Relative: 1 % (ref 0–1)
HCT: 34.1 % — ABNORMAL LOW (ref 36.0–46.0)
Hemoglobin: 12 g/dL (ref 12.0–15.0)
Lymphocytes Relative: 43 % (ref 12–46)
Lymphs Abs: 2.5 10*3/uL (ref 0.7–4.0)
MCHC: 35.2 g/dL (ref 30.0–36.0)
Monocytes Absolute: 0.4 10*3/uL (ref 0.1–1.0)
Monocytes Relative: 6 % (ref 3–12)
Neutro Abs: 2.7 10*3/uL (ref 1.7–7.7)
Neutrophils Relative %: 47 % (ref 43–77)
RBC: 3.78 MIL/uL — ABNORMAL LOW (ref 3.87–5.11)
WBC: 5.8 10*3/uL (ref 4.0–10.5)

## 2013-06-20 MED ORDER — SERTRALINE HCL 100 MG PO TABS: ORAL_TABLET | ORAL | Status: DC

## 2013-06-20 MED ORDER — ALPRAZOLAM 0.5 MG PO TABS: 0.5000 mg | ORAL_TABLET | Freq: Every evening | ORAL | Status: DC | PRN

## 2013-06-20 MED ORDER — BUPROPION HCL ER (XL) 150 MG PO TB24: 150.0000 mg | ORAL_TABLET | Freq: Every day | ORAL | Status: DC

## 2013-06-20 MED ORDER — LEVOTHYROXINE SODIUM 100 MCG PO TABS: 100.0000 ug | ORAL_TABLET | Freq: Every day | ORAL | Status: DC

## 2013-06-20 MED ORDER — TRAZODONE HCL 50 MG PO TABS: 50.0000 mg | ORAL_TABLET | Freq: Every evening | ORAL | Status: DC | PRN

## 2013-06-20 NOTE — Patient Instructions (Signed)
1.  DECREASE SERTRALINE TO 1 TABLET DAILY IN ONE MONTH. 2.  START WELLBUTRIN EVERY MORNING NOW.

## 2013-06-20 NOTE — Progress Notes (Signed)
[address]   [place], [place]  [address]   [id number]  Subjective:    Patient ID: Carolyn Campos, female    DOB: 1951/05/03, 62 y.o.   MRN: [id number] This chart was scribed for Ethelda Chick, MD by Valera Castle, ED Scribe. This patient was seen in room 21 and the patient's care was started at 11:43 AM.  HPI Carolyn Campos is a 62 y.o. female who presents to the [facility] for a 6 month follow up for HTN, hypothyroidism, depression, and Rx refill.   1.  Hypothyroidism:  She is wondering what it is about her thyroid that she has to take medication.  2.  SOB:  Six month follow-up; management at last visit included referring to cardiology.   She reports having an [place], and a stress test done recently. She reports needing her echocardiogram repeated in 1 year. She states she has had her flu vaccination. She reports taking all her prescribed medication regularly. She denies any complications for her BP medicine. She denies checking her BP at home.   She reports SOB, that she thinks is due to her weight. She states that walking does not exacerbated her SOB, just when she is walking up hills. She reports that she walks regularly. She reports struggling with weight loss, stating that when she is stressed she eats. She reports that she gets on herself everyday over her eating habits.   3. Depression/anxiety:  She reports wanting to have more energy, to feel well and to be more active and that she wants to feel good about herself. She reports feeling fatigued due to the battle with her weight loss. She is wondering if Wellbutrin is okay for her to take for weight loss. She reports crying episodes, but doesn't know why. She states being stressed out over all the events in her life. She states she is needing some medication refilled. She reports a h/o depression, but denies any SI. She reports that Jillyn Hidden is doing okay, despite some complications with his blood pressure and recently suffering with CVA. She states he  doesn't have any strength and is becoming inactive.   4. Fatigue:  She reports snoring, but denies knowing if she has sleep apnea. She denies falling asleep easily in the middle of the day.   5. Dermatitis scalp:  She reports that her rash has mostly subsided since her last visit, but states that it is still there. She reports doing okay with that and states she has her cream.   6. Glucose intolerance:  She reports the highest sugar level she has had is 108. She denies family h/o seizures. She denies h/o allergies.  She denies any LE pain, cough, LE swelling, abdominal pain, chest pain, and any other associated symptoms.  She reports her 37 year old grandson is living with her.   Patient Active Problem List   Diagnosis Date Noted  . Essential hypertension 12/26/2012  . Routine general medical examination at a health care facility 12/17/2012  . Anemia, unspecified 12/17/2012  . Other abnormal glucose 12/17/2012  . Dyspnea on exertion 12/17/2012  . Seborrhea capitis 12/17/2012  . Pure hypercholesterolemia 12/17/2012  . Breast cancer screening 12/17/2012  . IT band syndrome 12/17/2012  . Chest pain 11/24/2012  . SOB (shortness of breath) 11/24/2012  . Unspecified hypothyroidism 07/21/2012  . Anxiety and depression 07/21/2012  . Insomnia 07/21/2012  . Urticaria 07/21/2012   Past Medical History  Diagnosis Date  . Pure hypercholesterolemia   . Chest  pain, unspecified   . Postmenopausal bleeding   . Abdominal pain, unspecified site   . Chest pain, unspecified   . Anxiety state, unspecified   . Depressive disorder, not elsewhere classified   . Unspecified hypothyroidism   . Insomnia, unspecified   . Heart murmur     hx  . Hypertension    Past Surgical History  Procedure Laterality Date  . Cesarean section      x 2  . Dilation and curettage of uterus    . Arm surgery      x 4 post accident at work, with skin graft, [facility]  . Abdominal hysterectomy  04/16/2012    one  ovary resected.  No malignancy.  Post-menopausal bleeding.  [facility].  . Colonoscopy  08/16/2009    normal.  [facility].  Repeat in 10 years.   Allergies  Allergen Reactions  . Cephalexin Rash   Prior to Admission medications   Medication Sig Start Date End Date Taking? Authorizing Provider  ALPRAZolam Prudy Feeler) 0.5 MG tablet Take 1 tablet (0.5 mg total) by mouth at bedtime as needed. 11/14/12  Yes Ethelda Chick, MD  aspirin 81 MG tablet Take 81 mg by mouth daily.   Yes Historical Provider, MD  Fiber TABS 500 mg. Take one daily.   Yes Historical Provider, MD  fish oil-omega-3 fatty acids 1000 MG capsule Take 2 g by mouth daily.   Yes Historical Provider, MD  levothyroxine (SYNTHROID, LEVOTHROID) 100 MCG tablet Take 1 tablet (100 mcg total) by mouth daily. 11/16/12  Yes Ethelda Chick, MD  losartan (COZAAR) 25 MG tablet Take 1 tablet (25 mg total) by mouth daily. 12/25/12  Yes Iran Ouch, MD  sertraline (ZOLOFT) 100 MG tablet Take1.5 tabs by mouth daily. PATIENT NEEDS OFFICE VISIT FOR ADDITIONAL REFILLS 05/29/13  Yes Ethelda Chick, MD  traZODone (DESYREL) 50 MG tablet Take 1 tablet (50 mg total) by mouth at bedtime as needed. 11/14/12  Yes Ethelda Chick, MD  triamcinolone cream (KENALOG) 0.1 % Apply topically 2 (two) times daily. 11/14/12  Yes Ethelda Chick, MD    Review of Systems  Constitutional: Positive for activity change (decreased) and fatigue. Appetite change: unchanged.  Respiratory: Positive for shortness of breath. Negative for apnea, cough and chest tightness.   Cardiovascular: Negative.  Negative for chest pain and leg swelling.  Gastrointestinal: Negative.  Negative for abdominal pain.  Musculoskeletal: Negative.  Negative for arthralgias, gait problem and myalgias.  Skin: Positive for rash (improving).  Neurological: Negative.  Negative for dizziness, tremors, seizures, syncope, facial asymmetry, speech difficulty, weakness, light-headedness, numbness and headaches.   Psychiatric/Behavioral: Positive for dysphoric mood. Negative for suicidal ideas, sleep disturbance and self-injury. The patient is nervous/anxious.       Objective:   Physical Exam  Nursing note and vitals reviewed. Constitutional: She is oriented to person, place, and time. She appears well-developed and well-nourished. No distress.  HENT:  Head: Normocephalic and atraumatic.  Right Ear: External ear normal.  Left Ear: External ear normal.  Nose: Nose normal.  Mouth/Throat: Oropharynx is clear and moist.  Eyes: Conjunctivae and EOM are normal. Pupils are equal, round, and reactive to light.  Neck: Normal range of motion. Neck supple. No tracheal deviation present. No thyromegaly present.  Cardiovascular: Normal rate, regular rhythm and normal heart sounds.  Exam reveals no gallop and no friction rub.   No murmur heard. Pulmonary/Chest: Effort normal and breath sounds normal. No respiratory distress. She has no wheezes. She  has no rales.  Abdominal: Soft. Bowel sounds are normal. She exhibits no distension. There is no tenderness. There is no rebound and no guarding.  Musculoskeletal: Normal range of motion. She exhibits no edema and no tenderness.  Neurological: She is alert and oriented to person, place, and time. She displays normal reflexes. No cranial nerve deficit. Coordination normal.  Skin: Skin is warm and dry.  Psychiatric: She has a normal mood and affect. Her behavior is normal. Judgment and thought content normal.    Triage Vitals: BP 138/72  Pulse 66  Temp(Src) 98 F (36.7 C) (Oral)  Resp 16  Ht 5\' 3"  (1.6 m)  Wt 245 lb 3.2 oz (111.222 kg)  BMI 43.45 kg/m2  SpO2 96%    Assessment & Plan:  Pure hypercholesterolemia - Plan: Lipid panel  Unspecified hypothyroidism - Plan: TSH, T4, Free, levothyroxine (SYNTHROID, LEVOTHROID) 100 MCG tablet  Other abnormal glucose  Anemia, unspecified - Plan: CBC with Differential, Comprehensive metabolic panel  Anxiety and  depression - Plan: ALPRAZolam (XANAX) 0.5 MG tablet  Insomnia - Plan: ALPRAZolam (XANAX) 0.5 MG tablet, traZODone (DESYREL) 50 MG tablet  Depression - Plan: sertraline (ZOLOFT) 100 MG tablet  1.  Anxiety and depression: worsening due to multiple family stressors; counseling provided during visit; agreeable to adding Wellbutrin XL 150mg  daily; advised to decrease Zoloft in one month to 100mg  daily.  Continue Xanax 1/2 tablet qhs. 2.  Insomnia: controlled with Trazodone 50mg  qhs; refill provided. 3. Hypothyroidism: uncontrolled; dose increased at last visit; repeat labs; refill provided. 4.  SOB/DOE:  Improved since last visit; s/p cardiology consultation with stress testing, 2D-echo; overall negative.  If persists, refer to pulmonology though likely due to obesity. 5.  Hypercholesterolemia: uncontrolled; repeat today. If remains elevated, initiate statin due to family history of CAD. 6. HTN: new.  Tolerating Losartan 25mg  daily; obtain labs. 7. Anemia: improved at last visit; repeat today. 8. Glucose intolerance: New.  Sugars stable at home; recommend weight loss, exercise, low-carbohydrate food choices.  Meds ordered this encounter  Medications  . ALPRAZolam (XANAX) 0.5 MG tablet    Sig: Take 1 tablet (0.5 mg total) by mouth at bedtime as needed.    Dispense:  30 tablet    Refill:  5  . levothyroxine (SYNTHROID, LEVOTHROID) 100 MCG tablet    Sig: Take 1 tablet (100 mcg total) by mouth daily.    Dispense:  30 tablet    Refill:  11  . sertraline (ZOLOFT) 100 MG tablet    Sig: Take1.5 tabs by mouth daily.    Dispense:  45 tablet    Refill:  5    Order Specific Question:  Supervising Provider    Answer:  Ethelda Chick [[id number]]  . traZODone (DESYREL) 50 MG tablet    Sig: Take 1 tablet (50 mg total) by mouth at bedtime as needed.    Dispense:  30 tablet    Refill:  5  . buPROPion (WELLBUTRIN XL) 150 MG 24 hr tablet    Sig: Take 1 tablet (150 mg total) by mouth daily.    Dispense:  30  tablet    Refill:  5      I personally performed the services described in this documentation, which was scribed in my presence.  The recorded information has been reviewed and is accurate.

## 2013-06-21 LAB — T4, FREE: Free T4: 1.01 ng/dL (ref 0.80–1.80)

## 2013-06-21 LAB — TSH: TSH: 3.218 u[IU]/mL (ref 0.350–4.500)

## 2013-06-26 ENCOUNTER — Encounter: Payer: Self-pay | Admitting: Cardiovascular Disease

## 2013-06-26 ENCOUNTER — Ambulatory Visit (INDEPENDENT_AMBULATORY_CARE_PROVIDER_SITE_OTHER): Payer: Medicare Other | Admitting: Cardiovascular Disease

## 2013-06-26 VITALS — BP 138/70 | HR 72 | Ht 63.0 in | Wt 245.8 lb

## 2013-06-26 DIAGNOSIS — I10 Essential (primary) hypertension: Secondary | ICD-10-CM

## 2013-06-26 DIAGNOSIS — R0989 Other specified symptoms and signs involving the circulatory and respiratory systems: Secondary | ICD-10-CM

## 2013-06-26 DIAGNOSIS — E7800 Pure hypercholesterolemia, unspecified: Secondary | ICD-10-CM | POA: Diagnosis not present

## 2013-06-26 DIAGNOSIS — R0609 Other forms of dyspnea: Secondary | ICD-10-CM | POA: Diagnosis not present

## 2013-06-26 MED ORDER — LOSARTAN POTASSIUM 25 MG PO TABS: 25.0000 mg | ORAL_TABLET | Freq: Every day | ORAL | Status: DC

## 2013-06-26 NOTE — Assessment & Plan Note (Signed)
Blood pressure is well-controlled on losartan.  

## 2013-06-26 NOTE — Assessment & Plan Note (Signed)
Lab Results  Component Value Date   CHOL 231* 06/20/2013   HDL 44 06/20/2013   LDLCALC 167* 06/20/2013   TRIG 102 06/20/2013   CHOLHDL 5.3 06/20/2013   I advised her to start an exercise program and watch her diet.

## 2013-06-26 NOTE — Assessment & Plan Note (Signed)
Likely multifactorial due to physical deconditioning and mild diastolic heart failure. Echocardiogram showed normal LV systolic function but there was moderate concentric hypertrophy and grade 1 diastolic dysfunction.  There was the possibility of a small ASD with mild left to right shunting. The pulmonary pressure was normal. Both right atrium and ventricle were normal in size.  I will plan a repeat echocardiogram next year to ensure stability.

## 2013-06-26 NOTE — Progress Notes (Signed)
Primary care physician: Dr. Nilda Simmer  HPI  This is a pleasant 62 year old female who is here today for a followup visit regarding  exertional dyspnea . She has no previous cardiac history. She has no significant risk factors for coronary artery disease except for obesity. There is family history of coronary artery disease but not prematurely. Her father had CABG at the age of 55. She is a lifelong nonsmoker. Treadmill stress test in April of this year showed no evidence of ischemia. There was hypertensive response to exercise with associated dyspnea but no chest discomfort.  Echocardiogram which showed normal LV systolic function with moderate left ventricular hypertrophy, grade 1 diastolic dysfunction and possible small ASD with a tiny left-to-right shunt with normal right-sided chambers and pressures. She was noted to be hypertensive and was started on losartan. She has been doing reasonably well and reports improvement in dyspnea. No further chest pain. She continues to suffer from stress and anxiety.  Allergies  Allergen Reactions  . Cephalexin Rash     Current Outpatient Prescriptions on File Prior to Visit  Medication Sig Dispense Refill  . ALPRAZolam (XANAX) 0.5 MG tablet Take 1 tablet (0.5 mg total) by mouth at bedtime as needed.  30 tablet  5  . aspirin 81 MG tablet Take 81 mg by mouth daily.      Marland Kitchen buPROPion (WELLBUTRIN XL) 150 MG 24 hr tablet Take 1 tablet (150 mg total) by mouth daily.  30 tablet  5  . Fiber TABS 500 mg. Take one daily.      . fish oil-omega-3 fatty acids 1000 MG capsule Take 2 g by mouth daily.      Marland Kitchen levothyroxine (SYNTHROID, LEVOTHROID) 100 MCG tablet Take 1 tablet (100 mcg total) by mouth daily.  30 tablet  11  . losartan (COZAAR) 25 MG tablet Take 1 tablet (25 mg total) by mouth daily.  30 tablet  6  . sertraline (ZOLOFT) 100 MG tablet Take1.5 tabs by mouth daily.  45 tablet  5  . traZODone (DESYREL) 50 MG tablet Take 1 tablet (50 mg total) by mouth  at bedtime as needed.  30 tablet  5  . triamcinolone cream (KENALOG) 0.1 % Apply topically 2 (two) times daily.  30 g  0   No current facility-administered medications on file prior to visit.     Past Medical History  Diagnosis Date  . Pure hypercholesterolemia   . Chest pain, unspecified   . Postmenopausal bleeding   . Abdominal pain, unspecified site   . Chest pain, unspecified   . Anxiety state, unspecified   . Depressive disorder, not elsewhere classified   . Unspecified hypothyroidism   . Insomnia, unspecified   . Heart murmur     hx  . Hypertension      Past Surgical History  Procedure Laterality Date  . Cesarean section      x 2  . Dilation and curettage of uterus    . Arm surgery      x 4 post accident at work, with skin graft, [facility]  . Abdominal hysterectomy  04/16/2012    one ovary resected.  No malignancy.  Post-menopausal bleeding.  [facility].  . Colonoscopy  08/16/2009    normal.  [facility].  Repeat in 10 years.     Family History  Problem Relation Age of Onset  . Heart disease Brother     from drug abuse  . Alcohol abuse Brother   . Cirrhosis Brother   .  COPD Mother   . Heart disease Mother     CHF  . Heart failure Mother   . Cancer Father     lung, prostate  . Hyperlipidemia Father   . Heart disease Father 48    CABG  . Ulcers Brother      History   Social History  . Marital Status: Unknown    Spouse Name: N/A    Number of Children: 2  . Years of Education: 12   Occupational History  . disabled     due to arm surgery   Social History Main Topics  . Smoking status: Never Smoker   . Smokeless tobacco: Not on file  . Alcohol Use: No  . Drug Use: No  . Sexual Activity: Not on file   Other Topics Concern  . Not on file   Social History Narrative   Sexual activity:  Not sexually active.       Seatbelt:  Always uses seat belts.       Smoke alarm and carbon monoxide detector in the home.       Guns: Has no unsecured guns  in the home.       Caffeine:  Does not consume any caffeine.       Exercise: walks daily for 30 minutes walks 1-2 miles about every other day. Has dog.       Marital status:  Married x 41 years; moderately happy; married; no abuse;       Children:  2 daughters, 3 grandchildren/sons.       Organ donor: Yes.       Living Will: Patient does not have living will, DOES have HCPOA.      Employment: disability 2010 for [employer] comp injury arm.      PHYSICAL EXAM   BP 138/70  Pulse 72  Ht 5\' 3"  (1.6 m)  Wt 245 lb 12 oz (111.471 kg)  BMI 43.54 kg/m2 Constitutional: She is oriented to person, place, and time. She appears well-developed and well-nourished. No distress.  HENT: No nasal discharge.  Head: Normocephalic and atraumatic.  Eyes: Pupils are equal and round. Right eye exhibits no discharge. Left eye exhibits no discharge.  Neck: Normal range of motion. Neck supple. No JVD present. No thyromegaly present.  Cardiovascular: Normal rate, regular rhythm, normal heart sounds. Exam reveals no gallop and no friction rub. There is a 1/6 systolic ejection murmur at the aortic area  Pulmonary/Chest: Effort normal and breath sounds normal. No stridor. No respiratory distress. She has no wheezes. She has no rales. She exhibits no tenderness.  Abdominal: Soft. Bowel sounds are normal. She exhibits no distension. There is no tenderness. There is no rebound and no guarding.  Musculoskeletal: Normal range of motion. She exhibits no edema and no tenderness.  Neurological: She is alert and oriented to person, place, and time. Coordination normal.  Skin: Skin is warm and dry. No rash noted. She is not diaphoretic. No erythema. No pallor.  Psychiatric: She has a normal mood and affect. Her behavior is normal. Judgment and thought content normal.   EKG: Sinus  Rhythm  Low voltage -possible pulmonary disease.   ABNORMAL    ASSESSMENT AND PLAN

## 2013-06-26 NOTE — Patient Instructions (Signed)
Continue same medications.   Your physician wants you to follow-up in: 9 months.  You will receive a reminder letter in the mail two months in advance. If you don't receive a letter, please call our office to schedule the follow-up appointment.  

## 2013-06-27 MED ORDER — ATORVASTATIN CALCIUM 10 MG PO TABS: 10.0000 mg | ORAL_TABLET | Freq: Every day | ORAL | Status: DC

## 2013-06-27 NOTE — Addendum Note (Signed)
Addended by: Ethelda Chick on: 06/27/2013 11:53 AM   Modules accepted: Orders

## 2013-10-25 ENCOUNTER — Telehealth: Payer: Self-pay

## 2013-10-25 NOTE — Telephone Encounter (Signed)
Pharm faxed req per pt to increase her dose of bupropion xl to 2 (150mg ) tabs QD #60. Do you need to see pt back first? She has appt sch in May.

## 2013-10-27 MED ORDER — BUPROPION HCL ER (XL) 300 MG PO TB24: 300.0000 mg | ORAL_TABLET | Freq: Every day | ORAL | Status: DC

## 2013-10-27 NOTE — Telephone Encounter (Signed)
Sent in rx for Wellbutrin XL 300mg  one tablet daily; will follow-up in May regarding effectiveness.  Do not need to see her before.  Please call and advise patient of such.

## 2013-10-29 NOTE — Telephone Encounter (Signed)
Notified pt that Dr Katrinka BlazingSmith does not need to see her until appt for f/up. Pt was aware RF was sent.

## 2013-12-06 ENCOUNTER — Other Ambulatory Visit: Payer: Self-pay

## 2013-12-06 DIAGNOSIS — F329 Major depressive disorder, single episode, unspecified: Secondary | ICD-10-CM

## 2013-12-06 DIAGNOSIS — F32A Depression, unspecified: Secondary | ICD-10-CM

## 2013-12-06 MED ORDER — SERTRALINE HCL 100 MG PO TABS: ORAL_TABLET | ORAL | Status: DC

## 2013-12-06 MED ORDER — ATORVASTATIN CALCIUM 10 MG PO TABS: 10.0000 mg | ORAL_TABLET | Freq: Every day | ORAL | Status: DC

## 2013-12-14 ENCOUNTER — Encounter: Payer: Self-pay | Admitting: Podiatry

## 2013-12-14 ENCOUNTER — Ambulatory Visit (INDEPENDENT_AMBULATORY_CARE_PROVIDER_SITE_OTHER): Payer: Medicare Other | Admitting: Podiatry

## 2013-12-14 ENCOUNTER — Ambulatory Visit (INDEPENDENT_AMBULATORY_CARE_PROVIDER_SITE_OTHER): Payer: Medicare Other

## 2013-12-14 VITALS — BP 135/68 | HR 72 | Resp 16 | Ht 63.0 in | Wt 240.0 lb

## 2013-12-14 DIAGNOSIS — M722 Plantar fascial fibromatosis: Secondary | ICD-10-CM

## 2013-12-14 MED ORDER — DICLOFENAC SODIUM 75 MG PO TBEC: 75.0000 mg | DELAYED_RELEASE_TABLET | Freq: Two times a day (BID) | ORAL | Status: DC

## 2013-12-14 MED ORDER — TRIAMCINOLONE ACETONIDE 10 MG/ML IJ SUSP
10.0000 mg | Freq: Once | INTRAMUSCULAR | Status: AC
  Administered 2013-12-14: 10 mg

## 2013-12-14 NOTE — Progress Notes (Signed)
   Subjective:    Patient ID: Carolyn Campos, female    DOB: 1950/10/16, 63 y.o.   MRN: [id number]  HPI Comments: I am having right plantar heel pain , it has been going on for a year , it is progressively getting worse  Foot Pain      Review of Systems  All other systems reviewed and are negative.      Objective:   Physical Exam        Assessment & Plan:

## 2013-12-14 NOTE — Patient Instructions (Signed)

## 2013-12-16 NOTE — Progress Notes (Signed)
Subjective:     Patient ID: Carolyn Campos, female   DOB: [birth year], 63 y.o.   MRN: [id number]  Foot Pain   patient points to the right heel states that it has been very tender and making it hard to walk. States this is been going on for about a year and getting gradually worse   Review of Systems  All other systems reviewed and are negative.      Objective:   Physical Exam  Nursing note and vitals reviewed. Constitutional: She is oriented to person, place, and time.  Cardiovascular: Intact distal pulses.   Musculoskeletal: Normal range of motion.  Neurological: She is oriented to person, place, and time.  Skin: Skin is warm.   neurovascular status intact with history unchanged and range of motion of the subtalar midtarsal joint adequate with muscle strength adequate. Exquisite discomfort plantar aspect right heel at the insertion of the tendon into the calcaneus and digits found to be well perfused arch height found to be normal    Assessment:     Plantar fasciitis of the heel right with inflammation and fluid around the medial side    Plan:     H&P and x-ray performed and today I injected the right plantar fascia 3 mg Kenalog 5 mg Xylocaine Marcaine mixture dispensed fascially brace and gave instructions on reduced activity. Reappoint her recheck

## 2013-12-19 ENCOUNTER — Ambulatory Visit: Payer: Medicare Other | Admitting: Family Medicine

## 2013-12-21 ENCOUNTER — Ambulatory Visit: Payer: Medicare Other | Admitting: Podiatry

## 2013-12-21 ENCOUNTER — Ambulatory Visit (INDEPENDENT_AMBULATORY_CARE_PROVIDER_SITE_OTHER): Payer: Medicare Other | Admitting: Podiatry

## 2013-12-21 ENCOUNTER — Encounter: Payer: Self-pay | Admitting: Podiatry

## 2013-12-21 VITALS — BP 125/68 | HR 68 | Resp 16

## 2013-12-21 DIAGNOSIS — M722 Plantar fascial fibromatosis: Secondary | ICD-10-CM | POA: Diagnosis not present

## 2013-12-21 MED ORDER — TRIAMCINOLONE ACETONIDE 10 MG/ML IJ SUSP
10.0000 mg | Freq: Once | INTRAMUSCULAR | Status: AC
  Administered 2013-12-21: 10 mg

## 2013-12-23 NOTE — Progress Notes (Signed)
Subjective:     Patient ID: Carolyn EstimableWanda Campos, female   DOB: 22-Apr-1951, 63 y.o.   MRN: [id number]  HPI patient states her right heel is bothering her quite a bit but improved over when we started   Review of Systems     Objective:   Physical Exam Neurovascular status unchanged with pain to palpation right plantar heel at the insertion of the tendon into the calcaneus    Assessment:     Plantar fasciitis of the right heel insertional improved but still present    Plan:     Reinjected the plantar fascia 3 mg Kenalog 5 mg like Marcaine mixture and instructed on supportive shoes in physical therapy

## 2014-01-09 ENCOUNTER — Other Ambulatory Visit: Payer: Self-pay

## 2014-01-09 DIAGNOSIS — F32A Depression, unspecified: Secondary | ICD-10-CM

## 2014-01-09 DIAGNOSIS — F329 Major depressive disorder, single episode, unspecified: Secondary | ICD-10-CM

## 2014-01-09 MED ORDER — SERTRALINE HCL 100 MG PO TABS: ORAL_TABLET | ORAL | Status: DC

## 2014-01-09 MED ORDER — ATORVASTATIN CALCIUM 10 MG PO TABS: 10.0000 mg | ORAL_TABLET | Freq: Every day | ORAL | Status: DC

## 2014-01-30 ENCOUNTER — Other Ambulatory Visit: Payer: Self-pay

## 2014-01-30 DIAGNOSIS — G4700 Insomnia, unspecified: Secondary | ICD-10-CM

## 2014-01-30 DIAGNOSIS — F329 Major depressive disorder, single episode, unspecified: Secondary | ICD-10-CM

## 2014-01-30 DIAGNOSIS — F32A Depression, unspecified: Secondary | ICD-10-CM

## 2014-01-30 DIAGNOSIS — F419 Anxiety disorder, unspecified: Principal | ICD-10-CM

## 2014-01-30 MED ORDER — ALPRAZOLAM 0.5 MG PO TABS: 0.5000 mg | ORAL_TABLET | Freq: Every evening | ORAL | Status: DC | PRN

## 2014-01-30 MED ORDER — ATORVASTATIN CALCIUM 10 MG PO TABS: 10.0000 mg | ORAL_TABLET | Freq: Every day | ORAL | Status: DC

## 2014-01-30 MED ORDER — SERTRALINE HCL 100 MG PO TABS: ORAL_TABLET | ORAL | Status: DC

## 2014-01-30 NOTE — Telephone Encounter (Signed)
Yes, approved Alprazolam rx; please call into pharmacy.

## 2014-01-30 NOTE — Telephone Encounter (Signed)
Called in.

## 2014-01-30 NOTE — Telephone Encounter (Signed)
req from pharm to RF alprazolam (RFs are out of date). Dr Katrinka BlazingSmith, we gave pt notice last month w/other RFs that she needs OV, and I just gave her 2nd notice w/2 weeks of med on sertraline and atorvastatin. Do you want to give any alprazolam w/note? Pended.

## 2014-03-11 ENCOUNTER — Encounter: Payer: Self-pay | Admitting: Family Medicine

## 2014-03-11 ENCOUNTER — Ambulatory Visit (INDEPENDENT_AMBULATORY_CARE_PROVIDER_SITE_OTHER): Payer: Medicare Other | Admitting: Family Medicine

## 2014-03-11 VITALS — BP 139/70 | HR 64 | Temp 97.6°F | Resp 16 | Ht 63.0 in | Wt 247.8 lb

## 2014-03-11 DIAGNOSIS — F341 Dysthymic disorder: Secondary | ICD-10-CM | POA: Diagnosis not present

## 2014-03-11 DIAGNOSIS — E7800 Pure hypercholesterolemia, unspecified: Secondary | ICD-10-CM

## 2014-03-11 DIAGNOSIS — R0600 Dyspnea, unspecified: Secondary | ICD-10-CM

## 2014-03-11 DIAGNOSIS — I10 Essential (primary) hypertension: Secondary | ICD-10-CM

## 2014-03-11 DIAGNOSIS — R0609 Other forms of dyspnea: Secondary | ICD-10-CM

## 2014-03-11 DIAGNOSIS — E039 Hypothyroidism, unspecified: Secondary | ICD-10-CM

## 2014-03-11 DIAGNOSIS — F32A Depression, unspecified: Secondary | ICD-10-CM

## 2014-03-11 DIAGNOSIS — F329 Major depressive disorder, single episode, unspecified: Secondary | ICD-10-CM

## 2014-03-11 DIAGNOSIS — F3289 Other specified depressive episodes: Secondary | ICD-10-CM | POA: Diagnosis not present

## 2014-03-11 DIAGNOSIS — G4700 Insomnia, unspecified: Secondary | ICD-10-CM | POA: Diagnosis not present

## 2014-03-11 DIAGNOSIS — R0989 Other specified symptoms and signs involving the circulatory and respiratory systems: Secondary | ICD-10-CM

## 2014-03-11 DIAGNOSIS — R7309 Other abnormal glucose: Secondary | ICD-10-CM

## 2014-03-11 DIAGNOSIS — F419 Anxiety disorder, unspecified: Secondary | ICD-10-CM

## 2014-03-11 LAB — CBC WITH DIFFERENTIAL/PLATELET
Basophils Absolute: 0 10*3/uL (ref 0.0–0.1)
Basophils Relative: 0 % (ref 0–1)
EOS ABS: 0.1 10*3/uL (ref 0.0–0.7)
EOS PCT: 2 % (ref 0–5)
HEMATOCRIT: 34.7 % — AB (ref 36.0–46.0)
HEMOGLOBIN: 11.6 g/dL — AB (ref 12.0–15.0)
LYMPHS ABS: 2.5 10*3/uL (ref 0.7–4.0)
LYMPHS PCT: 41 % (ref 12–46)
MCH: 31.1 pg (ref 26.0–34.0)
MCHC: 33.4 g/dL (ref 30.0–36.0)
MCV: 93 fL (ref 78.0–100.0)
MONOS PCT: 6 % (ref 3–12)
Monocytes Absolute: 0.4 10*3/uL (ref 0.1–1.0)
Neutro Abs: 3.1 10*3/uL (ref 1.7–7.7)
Neutrophils Relative %: 51 % (ref 43–77)
PLATELETS: 245 10*3/uL (ref 150–400)
RBC: 3.73 MIL/uL — AB (ref 3.87–5.11)
RDW: 13.2 % (ref 11.5–15.5)
WBC: 6 10*3/uL (ref 4.0–10.5)

## 2014-03-11 MED ORDER — LOSARTAN POTASSIUM 25 MG PO TABS: 25.0000 mg | ORAL_TABLET | Freq: Every day | ORAL | Status: DC

## 2014-03-11 MED ORDER — SERTRALINE HCL 100 MG PO TABS: ORAL_TABLET | ORAL | Status: DC

## 2014-03-11 MED ORDER — LEVOTHYROXINE SODIUM 100 MCG PO TABS: 100.0000 ug | ORAL_TABLET | Freq: Every day | ORAL | Status: DC

## 2014-03-11 MED ORDER — ALPRAZOLAM 0.5 MG PO TABS: 0.5000 mg | ORAL_TABLET | Freq: Every evening | ORAL | Status: DC | PRN

## 2014-03-11 MED ORDER — TRAZODONE HCL 50 MG PO TABS
50.0000 mg | ORAL_TABLET | Freq: Every evening | ORAL | Status: DC | PRN
Start: 2014-03-11 — End: 2014-08-21

## 2014-03-11 MED ORDER — ATORVASTATIN CALCIUM 10 MG PO TABS: 10.0000 mg | ORAL_TABLET | Freq: Every day | ORAL | Status: DC

## 2014-03-11 NOTE — Progress Notes (Signed)
Subjective:    Patient ID: Carolyn Campos, female    DOB: 1950/09/25, 63 y.o.   MRN: [id number]  03/11/2014  Medication Refill, Depression, Hypothyroidism, Hyperlipidemia and Hypertension   HPI This 63 y.o. female presents for evaluation eight month follow-up:  1.  HTN:  Taking Losartan daily; does not check BP at home.  Patient reports good compliance with medication, good tolerance to medication, and good symptom control.  Denies CP/palp/SOB/leg swelling.  2. Hypercholesterolemia:  Taking Lipitor daily.  Patient reports good compliance with medication, good tolerance to medication, and good symptom control.  Denies HA/dizziness/focal weakness/paresthesias.  3.  Glucose Intolerance:  Fasting today.  No weight loss.  Continues to walk daily.  4.  Anxiety and depression:  Forgetting Wellbutrin most days.  Taking Zoloft 163m qhs.  Gets sad a lot but does not cry a lot.   [facility] facility where husband attends offers psychotherapy.  Husband had CVA; must use bath chair and cane.  No longer angry with husband; now he is unable to do for himself.  Taking 1/2 Xanax qhs.  Will take 1/2 Xanxax durrng the day as needed.    5. Hypothyroidism:  Takes Synthroid every morning.  Patient reports good compliance with medication, good tolerance to medication, and good symptom control.    6.  DOE: mother had a rare hereditary lung disease.  Feels tightness in throat.  No previous smoking.  S/p cardiology consultation; no cardiac source to DOE.  No leg swelling, no orthopnea; no chest pain.  No wheezing; no history of asthma.   Review of Systems  Constitutional: Negative for fever, chills, diaphoresis and fatigue.  Eyes: Negative for visual disturbance.  Respiratory: Positive for shortness of breath. Negative for cough, wheezing and stridor.   Cardiovascular: Negative for chest pain, palpitations and leg swelling.  Gastrointestinal: Negative for nausea, vomiting, abdominal pain, diarrhea and constipation.    Endocrine: Negative for cold intolerance, heat intolerance, polydipsia, polyphagia and polyuria.  Neurological: Negative for dizziness, tremors, seizures, syncope, facial asymmetry, speech difficulty, weakness, light-headedness, numbness and headaches.    Past Medical History  Diagnosis Date  . Pure hypercholesterolemia   . Chest pain, unspecified   . Postmenopausal bleeding   . Abdominal pain, unspecified site   . Chest pain, unspecified   . Anxiety state, unspecified   . Depressive disorder, not elsewhere classified   . Unspecified hypothyroidism   . Insomnia, unspecified   . Heart murmur     hx  . Hypertension    Past Surgical History  Procedure Laterality Date  . Cesarean section      x 2  . Dilation and curettage of uterus    . Arm surgery      [facility] 4 [facility] . Abdominal hysterectomy  04/16/2012    one ovary resected.  No malignancy.  Post-menopausal bleeding.  [facility].  . Colonoscopy  08/16/2009    normal.  [facility].  Repeat in 10 years.   Allergies  Allergen Reactions  . Cephalexin Rash   Current Outpatient Prescriptions  Medication Sig Dispense Refill  . ALPRAZolam (XANAX) 0.5 MG tablet Take 1 tablet (0.5 mg total) by mouth at bedtime as needed.  30 tablet  5  . aspirin 81 MG tablet Take 81 mg by mouth daily.      .Marland Kitchenatorvastatin (LIPITOR) 10 MG tablet Take 1 tablet (10 mg total) by mouth daily.  30 tablet  5  . buPROPion (WELLBUTRIN XL) 300  MG 24 hr tablet Take 1 tablet (300 mg total) by mouth daily.  30 tablet  5  . diclofenac (VOLTAREN) 75 MG EC tablet Take 1 tablet (75 mg total) by mouth 2 (two) times daily.  50 tablet  2  . Fiber TABS 500 mg. Take one daily.      . fish oil-omega-3 fatty acids 1000 MG capsule Take 2 g by mouth daily.      Marland Kitchen levothyroxine (SYNTHROID, LEVOTHROID) 100 MCG tablet Take 1 tablet (100 mcg total) by mouth daily.  30 tablet  11  . losartan (COZAAR) 25 MG tablet Take 1 tablet (25 mg total)  by mouth daily.  30 tablet  11  . sertraline (ZOLOFT) 100 MG tablet Take1.5 tabs by mouth daily.  45 tablet  5  . traZODone (DESYREL) 50 MG tablet Take 1 tablet (50 mg total) by mouth at bedtime as needed.  30 tablet  5  . triamcinolone cream (KENALOG) 0.1 % Apply topically 2 (two) times daily.  30 g  0   No current facility-administered medications for this visit.   History   Social History  . Marital Status: Married    Spouse Name: N/A    Number of Children: 2  . Years of Education: 12   Occupational History  . disabled     due to arm surgery   Social History Main Topics  . Smoking status: Never Smoker   . Smokeless tobacco: Not on file  . Alcohol Use: No  . Drug Use: No  . Sexual Activity: Not on file   Other Topics Concern  . Not on file   Social History Narrative   Sexual activity:  Not sexually active.       Seatbelt:  Always uses seat belts.       Smoke alarm and carbon monoxide detector in the home.       Guns: Has no unsecured guns in the home.       Caffeine:  Does not consume any caffeine.       Exercise: walks daily for 30 minutes walks 1-2 miles about every other day. Has dog.       Marital status:  Married x 41 years; moderately happy; married; no abuse;       Children:  2 daughters, 3 grandchildren/sons.       Organ donor: Yes.       Living Will: Patient does not have living will, DOES have HCPOA.      Employment: disability 2010 for [employer] comp injury arm.   Family History  Problem Relation Age of Onset  . Heart disease Brother     from drug abuse  . Alcohol abuse Brother   . Cirrhosis Brother   . COPD Mother   . Heart disease Mother     CHF  . Heart failure Mother   . Cancer Father     lung, prostate  . Hyperlipidemia Father   . Heart disease Father 62    CABG  . Ulcers Brother         Objective:    BP 139/70  Pulse 64  Temp(Src) 97.6 F (36.4 C) (Oral)  Resp 16  Ht 5' 3" (1.6 m)  Wt 247 lb 12.8 oz (112.401 kg)  BMI 43.91  kg/m2  SpO2 94% Physical Exam  Constitutional: She is oriented to person, place, and time. She appears well-developed and well-nourished. No distress.  HENT:  Head: Normocephalic and atraumatic.  Right Ear: External ear  normal.  Left Ear: External ear normal.  Nose: Nose normal.  Mouth/Throat: Oropharynx is clear and moist.  Eyes: Conjunctivae and EOM are normal. Pupils are equal, round, and reactive to light.  Neck: Normal range of motion. Neck supple. Carotid bruit is not present. No thyromegaly present.  Cardiovascular: Normal rate, regular rhythm, normal heart sounds and intact distal pulses.  Exam reveals no gallop and no friction rub.   No murmur heard. Pulmonary/Chest: Effort normal and breath sounds normal. She has no wheezes. She has no rales.  Abdominal: Soft. Bowel sounds are normal. She exhibits no distension and no mass. There is no tenderness. There is no rebound and no guarding.  Lymphadenopathy:    She has no cervical adenopathy.  Neurological: She is alert and oriented to person, place, and time. No cranial nerve deficit.  Skin: Skin is warm and dry. No rash noted. She is not diaphoretic. No erythema. No pallor.  Psychiatric: She has a normal mood and affect. Her behavior is normal.   Results for orders placed in visit on 03/11/14  CBC WITH DIFFERENTIAL      Result Value Ref Range   WBC 6.0  4.0 - 10.5 K/uL   RBC 3.73 (*) 3.87 - 5.11 MIL/uL   Hemoglobin 11.6 (*) 12.0 - 15.0 g/dL   HCT 34.7 (*) 36.0 - 46.0 %   MCV 93.0  78.0 - 100.0 fL   MCH 31.1  26.0 - 34.0 pg   MCHC 33.4  30.0 - 36.0 g/dL   RDW 13.2  11.5 - 15.5 %   Platelets 245  150 - 400 K/uL   Neutrophils Relative % 51  43 - 77 %   Neutro Abs 3.1  1.7 - 7.7 K/uL   Lymphocytes Relative 41  12 - 46 %   Lymphs Abs 2.5  0.7 - 4.0 K/uL   Monocytes Relative 6  3 - 12 %   Monocytes Absolute 0.4  0.1 - 1.0 K/uL   Eosinophils Relative 2  0 - 5 %   Eosinophils Absolute 0.1  0.0 - 0.7 K/uL   Basophils Relative 0   0 - 1 %   Basophils Absolute 0.0  0.0 - 0.1 K/uL   Smear Review Criteria for review not met    COMPREHENSIVE METABOLIC PANEL      Result Value Ref Range   Sodium 140  135 - 145 mEq/L   Potassium 4.1  3.5 - 5.3 mEq/L   Chloride 106  96 - 112 mEq/L   CO2 27  19 - 32 mEq/L   Glucose, Bld 101 (*) 70 - 99 mg/dL   BUN 14  6 - 23 mg/dL   Creat 0.77  0.50 - 1.10 mg/dL   Total Bilirubin 0.4  0.2 - 1.2 mg/dL   Alkaline Phosphatase 60  39 - 117 U/L   AST 16  0 - 37 U/L   ALT 13  0 - 35 U/L   Total Protein 6.9  6.0 - 8.3 g/dL   Albumin 4.4  3.5 - 5.2 g/dL   Calcium 9.6  8.4 - 10.5 mg/dL  HEMOGLOBIN A1C      Result Value Ref Range   Hemoglobin A1C 6.3 (*) <5.7 %   Mean Plasma Glucose 134 (*) <117 mg/dL  LIPID PANEL      Result Value Ref Range   Cholesterol 162  0 - 200 mg/dL   Triglycerides 82  <150 mg/dL   HDL 52  >39 mg/dL   Total  CHOL/HDL Ratio 3.1     VLDL 16  0 - 40 mg/dL   LDL Cholesterol 94  0 - 99 mg/dL  TSH      Result Value Ref Range   TSH 3.348  0.350 - 4.500 uIU/mL  T4, FREE      Result Value Ref Range   Free T4 0.88  0.80 - 1.80 ng/dL       Assessment & Plan:   1. Depression   2. Anxiety and depression   3. Insomnia   4. Unspecified hypothyroidism   5. Other abnormal glucose   6. Essential hypertension, benign   7. Pure hypercholesterolemia   8. Dyspnea    1. Anxiety with depression: moderately controlled; refill of Zoloft 123m 1.5 tablets daily provided; refill of Xanax provided as well. 2. Insomnia: controlled with nightly Trazodone and Xanax; refill provided. 3.  Hypothyroidism: controlled; refill provided; obtain labs. 4.  Glucose Intolerance: worsening; recommend weight loss, exercise, dietary modification. 5.  HTN: controlled; obtain labs; refill provided. 6.  Hypercholesterolemia: uncontrolled at last visit; tolerating Lipitor; levels much improved. 7. Dyspnea: persistent; s/p cardiology evaluation with negative work up.  Due to mother with hereditary  pulmonary disease, refer to pulmonology.    Meds ordered this encounter  Medications  . sertraline (ZOLOFT) 100 MG tablet    Sig: Take1.5 tabs by mouth daily.    Dispense:  45 tablet    Refill:  5  . atorvastatin (LIPITOR) 10 MG tablet    Sig: Take 1 tablet (10 mg total) by mouth daily.    Dispense:  30 tablet    Refill:  5  . ALPRAZolam (XANAX) 0.5 MG tablet    Sig: Take 1 tablet (0.5 mg total) by mouth at bedtime as needed.    Dispense:  30 tablet    Refill:  5  . traZODone (DESYREL) 50 MG tablet    Sig: Take 1 tablet (50 mg total) by mouth at bedtime as needed.    Dispense:  30 tablet    Refill:  5  . levothyroxine (SYNTHROID, LEVOTHROID) 100 MCG tablet    Sig: Take 1 tablet (100 mcg total) by mouth daily.    Dispense:  30 tablet    Refill:  11  . losartan (COZAAR) 25 MG tablet    Sig: Take 1 tablet (25 mg total) by mouth daily.    Dispense:  30 tablet    Refill:  11    Return for recheck.    KReginia Forts M.D.  Urgent [address]atteson[address]G[place]  [phone] [phone]

## 2014-03-12 LAB — HEMOGLOBIN A1C
HEMOGLOBIN A1C: 6.3 % — AB (ref <5.7)
MEAN PLASMA GLUCOSE: 134 mg/dL — AB (ref <117)

## 2014-03-12 LAB — COMPREHENSIVE METABOLIC PANEL
ALT: 13 U/L (ref 0–35)
AST: 16 U/L (ref 0–37)
Albumin: 4.4 g/dL (ref 3.5–5.2)
Alkaline Phosphatase: 60 U/L (ref 39–117)
BILIRUBIN TOTAL: 0.4 mg/dL (ref 0.2–1.2)
BUN: 14 mg/dL (ref 6–23)
CALCIUM: 9.6 mg/dL (ref 8.4–10.5)
CHLORIDE: 106 meq/L (ref 96–112)
CO2: 27 meq/L (ref 19–32)
CREATININE: 0.77 mg/dL (ref 0.50–1.10)
GLUCOSE: 101 mg/dL — AB (ref 70–99)
Potassium: 4.1 mEq/L (ref 3.5–5.3)
Sodium: 140 mEq/L (ref 135–145)
Total Protein: 6.9 g/dL (ref 6.0–8.3)

## 2014-03-12 LAB — LIPID PANEL
Cholesterol: 162 mg/dL (ref 0–200)
HDL: 52 mg/dL (ref >39)
LDL CALC: 94 mg/dL (ref 0–99)
TRIGLYCERIDES: 82 mg/dL (ref <150)
Total CHOL/HDL Ratio: 3.1 Ratio
VLDL: 16 mg/dL (ref 0–40)

## 2014-03-12 LAB — TSH: TSH: 3.348 u[IU]/mL (ref 0.350–4.500)

## 2014-03-12 LAB — T4, FREE: Free T4: 0.88 ng/dL (ref 0.80–1.80)

## 2014-03-19 MED ORDER — LEVOTHYROXINE SODIUM 112 MCG PO TABS: 112.0000 ug | ORAL_TABLET | Freq: Every day | ORAL | Status: DC

## 2014-03-19 NOTE — Addendum Note (Signed)
Addended by: Ethelda ChickSMITH, Tannie Koskela M on: 03/19/2014 11:53 AM   Modules accepted: Orders

## 2014-03-21 ENCOUNTER — Encounter: Payer: Self-pay | Admitting: *Deleted

## 2014-03-28 ENCOUNTER — Ambulatory Visit (INDEPENDENT_AMBULATORY_CARE_PROVIDER_SITE_OTHER): Payer: Medicare Other | Admitting: Cardiovascular Disease

## 2014-03-28 ENCOUNTER — Encounter: Payer: Self-pay | Admitting: Cardiovascular Disease

## 2014-03-28 ENCOUNTER — Ambulatory Visit (INDEPENDENT_AMBULATORY_CARE_PROVIDER_SITE_OTHER): Payer: Medicare Other | Admitting: Internal Medicine

## 2014-03-28 ENCOUNTER — Encounter: Payer: Self-pay | Admitting: Internal Medicine

## 2014-03-28 VITALS — BP 153/84 | HR 68 | Ht 63.0 in | Wt 246.5 lb

## 2014-03-28 VITALS — BP 128/70 | HR 73 | Ht 63.0 in | Wt 247.0 lb

## 2014-03-28 DIAGNOSIS — R0989 Other specified symptoms and signs involving the circulatory and respiratory systems: Secondary | ICD-10-CM | POA: Diagnosis not present

## 2014-03-28 DIAGNOSIS — I10 Essential (primary) hypertension: Secondary | ICD-10-CM

## 2014-03-28 DIAGNOSIS — E669 Obesity, unspecified: Secondary | ICD-10-CM | POA: Insufficient documentation

## 2014-03-28 DIAGNOSIS — R0609 Other forms of dyspnea: Secondary | ICD-10-CM | POA: Diagnosis not present

## 2014-03-28 DIAGNOSIS — R0602 Shortness of breath: Secondary | ICD-10-CM | POA: Diagnosis not present

## 2014-03-28 NOTE — Assessment & Plan Note (Signed)
Multifactorial: Obesity, mild CHF, and deconditioning. 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 This is suggestive of not having any coronary vasculature disease. Patient counseled on weight loss and proper eating habits. Currently following with cardiology for mild CHF (at this time I do not think that she has any significant heart disease that is contribution to her dyspnea on exertion). Given that she is only having dyspnea on exertion with incline exercises/uphill exercise, is highly suggestive of physical deconditioning. Patient counseled on increasing exercise tolerance and weight loss. Will rule out any obstructive or restrictive underlying pulmonary disease, given that she does have a family history of pulmonary fibrosis: Will check PFTs and follow 6 minute walk test. Followup in one month

## 2014-03-28 NOTE — Progress Notes (Signed)
Date: 03/28/2014  MRN# [id number] Carolyn Campos 03/22/1951    CC:  Chief Complaint  Patient presents with  . Advice Only    Referred by Dr. Nilda Simmer for worsening dyspnea.  Pt's mother has a hx of pulmonary fibrosis.  Pt has been experiencing this Xseveral years.      HPI:  Patient is 63 year old female presenting with a complaint of mild chronic shortness of breath. Referred to by Dr. Dan Humphreys. Patient states that she's had some shortness of breath mainly over the past year and would only occur with uphill or incline movements. She states that she usually walks about 2 miles a day on a level surface and she does not experience any dyspnea episodes. Today she also saw cardiology, Dr. Kirke Corin, who diagnosed her with grade 1 diastolic dysfunction and a small ASD. Patient tells me today that she did not experience any upper respiratory tract infection, hospitalization, travel out of country, or pneumonia about a year ago; her dyspnea just gradually started around that time. She denies any leg swelling, smoking, chronic exposure to toxic inhalants and admits to having 2 dogs (inside) that live with her. Patient tells me that her mother had pulmonary fibrosis, passed away 12/28/2007, was diagnosed in 12-28-1994, and was also on oxygen and steroids for pulmonary fibrosis. 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  PMHX:   Past Medical History  Diagnosis Date  . Pure hypercholesterolemia   . Chest pain, unspecified   . Postmenopausal bleeding   . Abdominal pain, unspecified site   . Chest pain, unspecified   . Anxiety state, unspecified   . Depressive disorder, not elsewhere classified   . Unspecified hypothyroidism   . Insomnia, unspecified   . Heart murmur     hx  . Hypertension     Surgical Hx:  Past Surgical History  Procedure Laterality Date  . Cesarean section      x 2  . Dilation and curettage of uterus    . Arm surgery      x 4 post accident at work, with skin graft, [facility]  . Abdominal hysterectomy  04/16/2012    one ovary resected.  No malignancy.  Post-menopausal bleeding.  [facility].  . Colonoscopy  08/16/2009    normal.  [facility].  Repeat in 10 years.   Family Hx:  Family History  Problem Relation Age of Onset  . Heart disease Brother     from drug abuse  . Alcohol abuse Brother   . Cirrhosis Brother   . COPD Mother   . Heart disease Mother     CHF  . Heart failure Mother   . Cancer Father     lung, prostate  . Hyperlipidemia Father   . Heart disease Father 40    CABG  . Ulcers Brother   . Pulmonary fibrosis Mother     died in 12/28/07   Social Hx:   History  Substance Use Topics  . Smoking status: Never Smoker   . Smokeless tobacco: Never Used  . Alcohol Use: No   Medication:    Home Medication:  Current Outpatient Rx  Name  Route  Sig  Dispense  Refill  . ALPRAZolam (XANAX) 0.5 MG tablet   Oral   Take 1 tablet (0.5 mg total) by mouth  at bedtime as needed.   30 tablet   5   . aspirin 81 MG tablet   Oral   Take 81 mg by mouth daily.         Marland Kitchen atorvastatin (LIPITOR) 10 MG tablet   Oral   Take 1 tablet (10 mg total) by mouth daily.   30 tablet   5   . Fiber TABS      500 mg. Take one daily.         . fish oil-omega-3 fatty acids 1000 MG capsule   Oral   Take 2 g by mouth daily.         Marland Kitchen levothyroxine (SYNTHROID, LEVOTHROID) 112 MCG tablet   Oral   Take 1 tablet (112 mcg total) by mouth daily.   30 tablet   11   . losartan (COZAAR) 25 MG tablet   Oral   Take 1 tablet (25 mg total) by mouth daily.   30 tablet   11   . sertraline (ZOLOFT) 100 MG tablet      Take1.5 tabs by mouth daily.   45 tablet   5   . traZODone (DESYREL) 50 MG tablet   Oral   Take 1 tablet (50 mg total) by mouth at  bedtime as needed.   30 tablet   5   . triamcinolone cream (KENALOG) 0.1 %   Topical   Apply topically 2 (two) times daily.   30 g   0       Allergies:  Cephalexin  Review of Systems: Gen:  Denies  fever, sweats, chills HEENT: Denies blurred vision, double vision, ear pain, eye pain, hearing loss, nose bleeds, sore throat Cvc:  No dizziness, chest pain or heaviness Resp:   cough or sputum porduction, shortness of breath, chest tightness on exertion Gi: Denies swallowing difficulty, stomach pain, nausea or vomiting, diarrhea, constipation, bowel incontinence Gu:  Denies bladder incontinence, burning urine Ext:   No Joint pain, stiffness or swelling Skin: No skin rash, easy bruising or bleeding or hives Endoc:  No polyuria, polydipsia , polyphagia or weight change Psych: No depression, insomnia or hallucinations  Other:  All other systems negative  Physical Examination:   VS: BP 128/70  Pulse 73  Ht 5\' 3"  (1.6 m)  Wt 247 lb (112.038 kg)  BMI 43.76 kg/m2  SpO2 100%  General Appearance: No distress  Neuro:without focal findings, mental status, speech normal, alert and oriented, cranial nerves 2-12 intact, reflexes normal and symmetric, sensation grossly normal  HEENT: PERRLA, EOM intact, no ptosis, no other lesions noticed Pulmonary: normal breath sounds., diaphragmatic excursion normal.No wheezing, No rales;   Sputum Production:  none CardiovascularNormal S1,S2.  No m/r/g.  Abdominal aorta pulsation normal.    Abdomen: Benign, Soft, non-tender, No masses, hepatosplenomegaly, No lymphadenopathy. Mild obese abdomen.  Renal:  No costovertebral tenderness  GU:  No performed at this time. Endoc: No evident thyromegaly, no signs of acromegaly or Cushing features Skin:   warm, no rashes, no ecchymosis  Extremities: normal, no cyanosis, clubbing, no edema, warm with normal capillary refill.    Rad results:   2-D echo 12/14/2012 Study Conclusions  - Left ventricle: The cavity  size was normal. There was moderate concentric hypertrophy. Systolic function was normal. The estimated ejection fraction was in the range of 55% to 65%. Wall motion was normal; there were no regional wall motion abnormalities. Doppler parameters are consistent with abnormal left ventricular relaxation (grade 1 diastolic dysfunction). - Mitral valve:  Calcified annulus. - Atrial septum: A septal defect with small left to right shunt cannot be excluded.  Assessment and Plan: Dyspnea on exertion Multifactorial: Obesity, mild CHF, and deconditioning. 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 This is suggestive of not having any coronary vasculature disease. Patient counseled on weight loss and proper eating habits. Currently following with cardiology for mild CHF (at this time I do not think that she has any significant heart disease that is contribution to her dyspnea on exertion). Given that she is only having dyspnea on exertion with incline exercises/uphill exercise, is highly suggestive of physical deconditioning. Patient counseled on increasing exercise tolerance and weight loss. Will rule out any obstructive or restrictive underlying pulmonary disease, given that she does have a family history of pulmonary fibrosis: Will check PFTs and follow 6 minute walk test. Followup in one month  Obesity (BMI 30-39.9) Patient counseled on diet and exercise. Time spent counseling patient 15-20 minutes. Goal weight loss 2-3 pounds in neck follow up visit. Patient also on exercise, 20 minutes of walking at least 3 times a week, as tolerated.     Updated Medication List Outpatient Encounter Prescriptions as of 03/28/2014  Medication Sig  . ALPRAZolam (XANAX) 0.5 MG tablet Take 1 tablet (0.5 mg total) by mouth at bedtime as needed.  Marland Kitchen. aspirin 81 MG tablet Take 81 mg by mouth daily.  Marland Kitchen. atorvastatin (LIPITOR) 10 MG tablet Take 1 tablet (10 mg total) by mouth daily.  .  Fiber TABS 500 mg. Take one daily.  . fish oil-omega-3 fatty acids 1000 MG capsule Take 2 g by mouth daily.  Marland Kitchen. levothyroxine (SYNTHROID, LEVOTHROID) 112 MCG tablet Take 1 tablet (112 mcg total) by mouth daily.  Marland Kitchen. losartan (COZAAR) 25 MG tablet Take 1 tablet (25 mg total) by mouth daily.  . sertraline (ZOLOFT) 100 MG tablet Take1.5 tabs by mouth daily.  . traZODone (DESYREL) 50 MG tablet Take 1 tablet (50 mg total) by mouth at bedtime as needed.  . triamcinolone cream (KENALOG) 0.1 % Apply topically 2 (two) times daily.      Thank  you consulting [facility].  Stephanie AcreVishal Mungal, MD [facility]

## 2014-03-28 NOTE — Assessment & Plan Note (Signed)
Blood pressure is mildly elevated today it has been running reasonably well. Continue losartan.

## 2014-03-28 NOTE — Assessment & Plan Note (Signed)
Patient counseled on diet and exercise. Time spent counseling patient 15-20 minutes. Goal weight loss 2-3 pounds in neck follow up visit. Patient also on exercise, 20 minutes of walking at least 3 times a week, as tolerated.

## 2014-03-28 NOTE — Progress Notes (Signed)
Primary care physician: Dr. Nilda SimmerKristi Campos  HPI  This is a pleasant 63 year old female who is here today for a followup visit regarding  exertional dyspnea . She has no previous cardiac history. She has no significant risk factors for coronary artery disease except for obesity. There is family history of coronary artery disease but not prematurely. Her father had CABG at the age of 863. She is a lifelong nonsmoker. Treadmill stress test in April of 2014 showed no evidence of ischemia. There was hypertensive response to exercise with associated dyspnea but no chest discomfort.  Echocardiogram which showed normal LV systolic function with moderate left ventricular hypertrophy, grade 1 diastolic dysfunction and possible small ASD with a tiny left-to-right shunt with normal right-sided chambers and pressures. She was noted to be hypertensive and was started on losartan. She has been doing reasonably well and reports improvement in dyspnea. She reports occasional tightness and throat with activities but this has not consistent. She is going to see pulmonary today.  Allergies  Allergen Reactions  . Cephalexin Rash     Current Outpatient Prescriptions on File Prior to Visit  Medication Sig Dispense Refill  . ALPRAZolam (XANAX) 0.5 MG tablet Take 1 tablet (0.5 mg total) by mouth at bedtime as needed.  30 tablet  5  . aspirin 81 MG tablet Take 81 mg by mouth daily.      Marland Kitchen. atorvastatin (LIPITOR) 10 MG tablet Take 1 tablet (10 mg total) by mouth daily.  30 tablet  5  . Fiber TABS 500 mg. Take one daily.      . fish oil-omega-3 fatty acids 1000 MG capsule Take 2 g by mouth daily.      Marland Kitchen. levothyroxine (SYNTHROID, LEVOTHROID) 112 MCG tablet Take 1 tablet (112 mcg total) by mouth daily.  30 tablet  11  . losartan (COZAAR) 25 MG tablet Take 1 tablet (25 mg total) by mouth daily.  30 tablet  11  . sertraline (ZOLOFT) 100 MG tablet Take1.5 tabs by mouth daily.  45 tablet  5  . traZODone (DESYREL) 50 MG  tablet Take 1 tablet (50 mg total) by mouth at bedtime as needed.  30 tablet  5  . triamcinolone cream (KENALOG) 0.1 % Apply topically 2 (two) times daily.  30 g  0   No current facility-administered medications on file prior to visit.     Past Medical History  Diagnosis Date  . Pure hypercholesterolemia   . Chest pain, unspecified   . Postmenopausal bleeding   . Abdominal pain, unspecified site   . Chest pain, unspecified   . Anxiety state, unspecified   . Depressive disorder, not elsewhere classified   . Unspecified hypothyroidism   . Insomnia, unspecified   . Heart murmur     hx  . Hypertension      Past Surgical History  Procedure Laterality Date  . Cesarean section      x 2  . Dilation and curettage of uterus    . Arm surgery      [facility]  . Abdominal hysterectomy  04/16/2012    one ovary resected.  No malignancy.  Post-menopausal bleeding.  [facility].  . Colonoscopy  08/16/2009    normal.  [facility].  Repeat in 10 years.     Family History  Problem Relation Age of Onset  . Heart disease Brother     from drug abuse  . Alcohol abuse Brother   .  Cirrhosis Brother   . COPD Mother   . Heart disease Mother     CHF  . Heart failure Mother   . Cancer Father     lung, prostate  . Hyperlipidemia Father   . Heart disease Father 86    CABG  . Ulcers Brother      History   Social History  . Marital Status: Married    Spouse Name: N/A    Number of Children: 2  . Years of Education: 12   Occupational History  . disabled     due to arm surgery   Social History Main Topics  . Smoking status: Never Smoker   . Smokeless tobacco: Not on file  . Alcohol Use: No  . Drug Use: No  . Sexual Activity: Not on file   Other Topics Concern  . Not on file   Social History Narrative   Sexual activity:  Not sexually active.       Seatbelt:  Always uses seat belts.       Smoke alarm and carbon monoxide detector in  the home.       Guns: Has no unsecured guns in the home.       Caffeine:  Does not consume any caffeine.       Exercise: walks daily for 30 minutes walks 1-2 miles about every other day. Has dog.       Marital status:  Married x 41 years; moderately happy; married; no abuse;       Children:  2 daughters, 3 grandchildren/sons.       Organ donor: Yes.       Living Will: Patient does not have living will, DOES have HCPOA.      Employment: disability 2010 for [employer] comp injury arm.      PHYSICAL EXAM   BP 153/84  Pulse 68  Ht 5\' 3"  (1.6 m)  Wt 246 lb 8 oz (111.812 kg)  BMI 43.68 kg/m2 Constitutional: She is oriented to person, place, and time. She appears well-developed and well-nourished. No distress.  HENT: No nasal discharge.  Head: Normocephalic and atraumatic.  Eyes: Pupils are equal and round. Right eye exhibits no discharge. Left eye exhibits no discharge.  Neck: Normal range of motion. Neck supple. No JVD present. No thyromegaly present.  Cardiovascular: Normal rate, regular rhythm, normal heart sounds. Exam reveals no gallop and no friction rub. There is a 1/6 systolic ejection murmur at the aortic area  Pulmonary/Chest: Effort normal and breath sounds normal. No stridor. No respiratory distress. She has no wheezes. She has no rales. She exhibits no tenderness.  Abdominal: Soft. Bowel sounds are normal. She exhibits no distension. There is no tenderness. There is no rebound and no guarding.  Musculoskeletal: Normal range of motion. She exhibits no edema and no tenderness.  Neurological: She is alert and oriented to person, place, and time. Coordination normal.  Skin: Skin is warm and dry. No rash noted. She is not diaphoretic. No erythema. No pallor.  Psychiatric: She has a normal mood and affect. Her behavior is normal. Judgment and thought content normal.   EKG: Sinus  Rhythm  WITHIN NORMAL LIMITS   ASSESSMENT AND PLAN

## 2014-03-28 NOTE — Patient Instructions (Signed)
We will see you back in 1 month

## 2014-03-28 NOTE — Assessment & Plan Note (Signed)
Likely multifactorial due to physical deconditioning and mild diastolic heart failure. Echocardiogram last year showed normal LV systolic function but there was moderate concentric hypertrophy and grade 1 diastolic dysfunction.  There was the possibility of a small ASD with mild left to right shunting. The pulmonary pressure was normal. Both right atrium and ventricle were normal in size.  Symptoms are stable. She is going to see pulmonary today for evaluation. I will likely repeat echocardiogram in 6-12 months.

## 2014-03-28 NOTE — Patient Instructions (Signed)
Continue same medications.   Your physician wants you to follow-up in: 6 months.  You will receive a reminder letter in the mail two months in advance. If you don't receive a letter, please call our office to schedule the follow-up appointment.  

## 2014-04-04 ENCOUNTER — Ambulatory Visit: Payer: Self-pay | Admitting: Internal Medicine

## 2014-04-04 DIAGNOSIS — R0609 Other forms of dyspnea: Secondary | ICD-10-CM | POA: Diagnosis not present

## 2014-04-04 DIAGNOSIS — R0602 Shortness of breath: Secondary | ICD-10-CM | POA: Diagnosis not present

## 2014-04-16 ENCOUNTER — Telehealth: Payer: Self-pay

## 2014-04-16 DIAGNOSIS — F32A Depression, unspecified: Secondary | ICD-10-CM

## 2014-04-16 DIAGNOSIS — F329 Major depressive disorder, single episode, unspecified: Secondary | ICD-10-CM

## 2014-04-16 MED ORDER — SERTRALINE HCL 100 MG PO TABS: ORAL_TABLET | ORAL | Status: DC

## 2014-04-16 NOTE — Telephone Encounter (Signed)
Approved.  

## 2014-04-16 NOTE — Telephone Encounter (Signed)
Carolyn Campos sent req stating pt reqs increase in dosage of Zoloft. She is currently taking 1 and 1/2 of the 100 mg tablets. Pended 2 tabs per day for your review.

## 2014-05-13 ENCOUNTER — Encounter: Payer: Self-pay | Admitting: Internal Medicine

## 2014-05-13 ENCOUNTER — Ambulatory Visit (INDEPENDENT_AMBULATORY_CARE_PROVIDER_SITE_OTHER): Payer: Medicare Other | Admitting: Internal Medicine

## 2014-05-13 VITALS — BP 118/68 | HR 66 | Ht 63.0 in | Wt 245.0 lb

## 2014-05-13 DIAGNOSIS — R0989 Other specified symptoms and signs involving the circulatory and respiratory systems: Secondary | ICD-10-CM

## 2014-05-13 DIAGNOSIS — R0602 Shortness of breath: Secondary | ICD-10-CM

## 2014-05-13 DIAGNOSIS — R0609 Other forms of dyspnea: Secondary | ICD-10-CM | POA: Diagnosis not present

## 2014-05-13 NOTE — Assessment & Plan Note (Signed)
Will continue with same management for dyspnea: Weight loss, increase exercise, six-month followup.

## 2014-05-13 NOTE — Progress Notes (Signed)
Subjective:    Patient ID: Carolyn Campos, female    DOB: Oct 15, 1950, 63 y.o.   MRN: [id number]  HPI    Review of Systems  MRN# [id number] Carolyn Campos [birth year]   CC: Chief Complaint  Patient presents with  . Follow-up    Review PFT.  Pt's SOB is stable.  No other complaints at this time.     Events since last clinic visit: No acute events since her last clinic visit Patient states that today she is actually doing better in terms of her breathing. States that she lost about 3 pounds since her last visit. Still with some shortness of breath only with exertion, otherwise no cough, no sputum production, no acid reflux symptoms.  States that she's able to walk on flat surfaces without any major respiratory issues, however on an inclined surface does bring on some mild dyspnea. Patient has completed her pulmonary function studies, and would like to review the results today.    PMHX:   Past Medical History  Diagnosis Date  . Pure hypercholesterolemia   . Chest pain, unspecified   . Postmenopausal bleeding   . Abdominal pain, unspecified site   . Chest pain, unspecified   . Anxiety state, unspecified   . Depressive disorder, not elsewhere classified   . Unspecified hypothyroidism   . Insomnia, unspecified   . Heart murmur     hx  . Hypertension    Surgical Hx:  Past Surgical History  Procedure Laterality Date  . Cesarean section      x 2  . Dilation and curettage of uterus    . Arm surgery      x 4 post accident at work, with skin graft, [facility]  . Abdominal hysterectomy  04/16/2012    one ovary resected.  No malignancy.  Post-menopausal bleeding.  [facility].  . Colonoscopy  08/16/2009    normal.  [facility].  Repeat in 10 years.   Family Hx:  Family History  Problem Relation Age of Onset  . Heart disease Brother     from drug abuse  . Alcohol abuse Brother   . Cirrhosis Brother   . COPD Mother   . Heart disease Mother     CHF  . Heart failure Mother     . Cancer Father     lung, prostate  . Hyperlipidemia Father   . Heart disease Father 3    CABG  . Ulcers Brother   . Pulmonary fibrosis Mother     died in Jan 08, 2008   Social Hx:   History  Substance Use Topics  . Smoking status: Never Smoker   . Smokeless tobacco: Never Used  . Alcohol Use: No   Medication:   Current Outpatient Rx  Name  Route  Sig  Dispense  Refill  . ALPRAZolam (XANAX) 0.5 MG tablet   Oral   Take 1 tablet (0.5 mg total) by mouth at bedtime as needed.   30 tablet   5   . aspirin 81 MG tablet   Oral   Take 81 mg by mouth daily.         Marland Kitchen atorvastatin (LIPITOR) 10 MG tablet   Oral   Take 1 tablet (10 mg total) by mouth daily.   30 tablet   5   . Fiber TABS      500 mg. Take one daily.         . fish oil-omega-3 fatty acids 1000 MG capsule   Oral  Take 2 g by mouth daily.         Marland Kitchen levothyroxine (SYNTHROID, LEVOTHROID) 112 MCG tablet   Oral   Take 1 tablet (112 mcg total) by mouth daily.   30 tablet   11   . losartan (COZAAR) 25 MG tablet   Oral   Take 1 tablet (25 mg total) by mouth daily.   30 tablet   11   . sertraline (ZOLOFT) 100 MG tablet      Take 2 tabs by mouth daily.   60 tablet   5   . traZODone (DESYREL) 50 MG tablet   Oral   Take 1 tablet (50 mg total) by mouth at bedtime as needed.   30 tablet   5   . triamcinolone cream (KENALOG) 0.1 %   Topical   Apply topically 2 (two) times daily.   30 g   0      Review of Systems: Gen:  Denies  fever, sweats, chills HEENT: Denies blurred vision, double vision, ear pain, eye pain, hearing loss, nose bleeds, sore throat Cvc:  No dizziness, chest pain or heaviness Resp:   Denies cough or sputum porduction, shortness of breath Gi: Denies swallowing difficulty, stomach pain, nausea or vomiting, diarrhea, constipation, bowel incontinence Gu:  Denies bladder incontinence, burning urine Ext:   No Joint pain, stiffness or swelling Skin: No skin rash, easy bruising or  bleeding or hives Endoc:  No polyuria, polydipsia , polyphagia or weight change Psych: No depression, insomnia or hallucinations  Other:  All other systems negative  Allergies:  Cephalexin  Physical Examination:  VS: BP 118/68  Pulse 66  Ht  (1.6 m)  Wt 245 lb (111.131 kg)  BMI 43.41 kg/m2  SpO2 95%  General Appearance: No distress  Neuro: EXAM: without focal findings, mental status, speech normal, alert and oriented, cranial nerves 2-12 grossly normal  HEENT: PERRLA, EOM intact, no ptosis, no other lesions noticed Pulmonary:Exam: normal breath sounds., diaphragmatic excursion normal.No wheezing, No rales   Cardiovascular:@ Exam:  Normal S1,S2.  No m/r/g.     Abdomen:Exam: Benign, Soft, non-tender, No masses  Skin:   warm, no rashes, no ecchymosis  Extremities: normal, no cyanosis, clubbing, no edema, warm with normal capillary refill.   Labs results:  BMP Lab Results  Component Value Date   NA 140 03/11/2014   K 4.1 03/11/2014   CL 106 03/11/2014   CO2 27 03/11/2014   GLUCOSE 101* 03/11/2014   BUN 14 03/11/2014   CREATININE 0.77 03/11/2014     CBC CBC Latest Ref Rng 03/11/2014 06/20/2013 11/14/2012  WBC 4.0 - 10.5 K/uL 6.0 5.8 5.8  Hemoglobin 12.0 - 15.0 g/dL 11.6(L) 12.0 12.6  Hematocrit 36.0 - 46.0 % 34.7(L) 34.1(L) 35.8(L)  Platelets 150 - 400 K/uL 245 254 254     PFT at this time by ATS criteria, is evident for:  No significant obstruction. Mild restriction, with decreased ERV, most likely related to obesity, clinical correlation advised. FEV1: Actual 2.1   Predicted 99% FEV1/FVC: Actual 84%  Predicted %   RV: Actual 1.17   Predicted 66% ERV: Actual 0.15   Predicted 16% TLC: Actual 3.68   Predicted 79% RV/TLC ([place])(%): Actual 32 Predicted %  DLCO2:cor: Actual 16.4  Predicted 63%     Assessment and Plan: SOB (shortness of breath) Multifactorial: Obesity, mild CHF, and deconditioning. 6 minute walk test on a previous visit showed that there were no  desaturation events noted. This is suggestive of  not having any pulmonary vasculature disease. Patient counseled on weight loss and proper eating habits. She has lost 2 pounds since her last clinic visit. Currently following with cardiology for mild CHF (at this time I do not think that she has any significant heart disease that is contribution to her dyspnea on exertion). Given that she is only having dyspnea on exertion with incline exercises/uphill exercise, is highly suggestive of physical deconditioning. Patient counseled on increasing exercise tolerance and weight loss. Her PFTs doesn't show any major obstructive process, she does have a mild restriction secondary to her weight. Followup in 6 months, if no improvement will weight loss, dietary modifications, will then consider the cardiopulmonary exercise stress testing.    Dyspnea on exertion Will continue with same management for dyspnea: Weight loss, increase exercise, six-month followup.    Updated Medication List Outpatient Encounter Prescriptions as of 05/13/2014  Medication Sig  . ALPRAZolam (XANAX) 0.5 MG tablet Take 1 tablet (0.5 mg total) by mouth at bedtime as needed.  Marland Kitchen aspirin 81 MG tablet Take 81 mg by mouth daily.  Marland Kitchen atorvastatin (LIPITOR) 10 MG tablet Take 1 tablet (10 mg total) by mouth daily.  . Fiber TABS 500 mg. Take one daily.  . fish oil-omega-3 fatty acids 1000 MG capsule Take 2 g by mouth daily.  Marland Kitchen levothyroxine (SYNTHROID, LEVOTHROID) 112 MCG tablet Take 1 tablet (112 mcg total) by mouth daily.  Marland Kitchen losartan (COZAAR) 25 MG tablet Take 1 tablet (25 mg total) by mouth daily.  . sertraline (ZOLOFT) 100 MG tablet Take 2 tabs by mouth daily.  . traZODone (DESYREL) 50 MG tablet Take 1 tablet (50 mg total) by mouth at bedtime as needed.  . triamcinolone cream (KENALOG) 0.1 % Apply topically 2 (two) times daily.    Thank  you for the visitation and for allowing  [facility] and to assist in the  care of your patient. Our recommendations are noted above.  Please contact us if we can be of further service.  Stephanie Acre, MD [facility] Office Number: [phone]       Objective:   Physical Exam        Assessment & Plan:

## 2014-05-13 NOTE — Patient Instructions (Signed)
We will see you back in 6months

## 2014-05-13 NOTE — Assessment & Plan Note (Signed)
Multifactorial: Obesity, mild CHF, and deconditioning. 6 minute walk test on a previous visit showed that there were no desaturation events noted. This is suggestive of not having any pulmonary vasculature disease. Patient counseled on weight loss and proper eating habits. She has lost 2 pounds since her last clinic visit. Currently following with cardiology for mild CHF (at this time I do not think that she has any significant heart disease that is contribution to her dyspnea on exertion). Given that she is only having dyspnea on exertion with incline exercises/uphill exercise, is highly suggestive of physical deconditioning. Patient counseled on increasing exercise tolerance and weight loss. Her PFTs doesn't show any major obstructive process, she does have a mild restriction secondary to her weight. Followup in 6 months, if no improvement will weight loss, dietary modifications, will then consider the cardiopulmonary exercise stress testing.

## 2014-05-22 DIAGNOSIS — Z23 Encounter for immunization: Secondary | ICD-10-CM | POA: Diagnosis not present

## 2014-08-21 ENCOUNTER — Other Ambulatory Visit: Payer: Self-pay

## 2014-08-21 DIAGNOSIS — E7800 Pure hypercholesterolemia, unspecified: Secondary | ICD-10-CM

## 2014-08-21 DIAGNOSIS — G4700 Insomnia, unspecified: Secondary | ICD-10-CM

## 2014-08-21 MED ORDER — TRAZODONE HCL 50 MG PO TABS: 50.0000 mg | ORAL_TABLET | Freq: Every evening | ORAL | Status: DC | PRN

## 2014-08-21 MED ORDER — ATORVASTATIN CALCIUM 10 MG PO TABS: 10.0000 mg | ORAL_TABLET | Freq: Every day | ORAL | Status: DC

## 2014-09-09 ENCOUNTER — Ambulatory Visit: Payer: Medicare Other | Admitting: Family Medicine

## 2014-09-18 ENCOUNTER — Other Ambulatory Visit: Payer: Self-pay

## 2014-09-18 DIAGNOSIS — F419 Anxiety disorder, unspecified: Principal | ICD-10-CM

## 2014-09-18 DIAGNOSIS — G4700 Insomnia, unspecified: Secondary | ICD-10-CM

## 2014-09-18 DIAGNOSIS — F329 Major depressive disorder, single episode, unspecified: Secondary | ICD-10-CM

## 2014-09-18 DIAGNOSIS — E7800 Pure hypercholesterolemia, unspecified: Secondary | ICD-10-CM

## 2014-09-18 MED ORDER — ATORVASTATIN CALCIUM 10 MG PO TABS: 10.0000 mg | ORAL_TABLET | Freq: Every day | ORAL | Status: DC

## 2014-09-18 MED ORDER — TRAZODONE HCL 50 MG PO TABS: 50.0000 mg | ORAL_TABLET | Freq: Every evening | ORAL | Status: DC | PRN

## 2014-09-18 NOTE — Telephone Encounter (Signed)
Pharm reqs RF of alprazolam. Pt last seen in July and I have RFd other Rxs last month and this with note to RTC. Do you want to give a RF of this?

## 2014-09-19 MED ORDER — ALPRAZOLAM 0.5 MG PO TABS: 0.5000 mg | ORAL_TABLET | Freq: Every evening | ORAL | Status: DC | PRN

## 2014-09-19 NOTE — Telephone Encounter (Signed)
Refill of Xanax approved for one month only.  Please call and advise patient that she must have OV to receive further refills of Xanax. Please schedule her an appointment.

## 2014-09-19 NOTE — Telephone Encounter (Signed)
Called in Rx w/note and notified pt of need for f/up. appt center closed but pt stated she will CB to sch appt in morning. I will forward this to scheduling as well.

## 2014-10-10 ENCOUNTER — Other Ambulatory Visit: Payer: Self-pay

## 2014-10-10 DIAGNOSIS — F32A Depression, unspecified: Secondary | ICD-10-CM

## 2014-10-10 DIAGNOSIS — G4700 Insomnia, unspecified: Secondary | ICD-10-CM

## 2014-10-10 DIAGNOSIS — F329 Major depressive disorder, single episode, unspecified: Secondary | ICD-10-CM

## 2014-10-10 DIAGNOSIS — E7800 Pure hypercholesterolemia, unspecified: Secondary | ICD-10-CM

## 2014-10-10 MED ORDER — TRAZODONE HCL 50 MG PO TABS: 50.0000 mg | ORAL_TABLET | Freq: Every evening | ORAL | Status: DC | PRN

## 2014-10-10 MED ORDER — ATORVASTATIN CALCIUM 10 MG PO TABS: 10.0000 mg | ORAL_TABLET | Freq: Every day | ORAL | Status: DC

## 2014-10-10 MED ORDER — SERTRALINE HCL 100 MG PO TABS: ORAL_TABLET | ORAL | Status: DC

## 2014-10-17 ENCOUNTER — Encounter: Payer: Self-pay | Admitting: Cardiovascular Disease

## 2014-10-17 ENCOUNTER — Ambulatory Visit (INDEPENDENT_AMBULATORY_CARE_PROVIDER_SITE_OTHER): Payer: Medicare Other | Admitting: Cardiovascular Disease

## 2014-10-17 VITALS — BP 128/60 | HR 67 | Ht 63.0 in | Wt 249.2 lb

## 2014-10-17 DIAGNOSIS — I10 Essential (primary) hypertension: Secondary | ICD-10-CM

## 2014-10-17 DIAGNOSIS — M79602 Pain in left arm: Secondary | ICD-10-CM | POA: Diagnosis not present

## 2014-10-17 DIAGNOSIS — R0602 Shortness of breath: Secondary | ICD-10-CM | POA: Diagnosis not present

## 2014-10-17 NOTE — Patient Instructions (Signed)

## 2014-10-17 NOTE — Progress Notes (Signed)
Primary care physician: Dr. Nilda SimmerKristi Campos  HPI  This is a pleasant 64 year old female who is here today for a followup visit regarding  exertional dyspnea and small ASD . She has no significant risk factors for coronary artery disease except for obesity. There is family history of coronary artery disease but not prematurely. Her father had CABG at the age of 63. She is a lifelong nonsmoker. Treadmill stress test in April of 2014 showed no evidence of ischemia. There was hypertensive response to exercise with associated dyspnea but no chest discomfort.  Echocardiogram which showed normal LV systolic function with moderate left ventricular hypertrophy, grade 1 diastolic dysfunction and possible small ASD with a tiny left-to-right shunt with normal right-sided chambers and pressures.  She has been doing reasonably well and denies any chest pain. Dyspnea is stable.  Allergies  Allergen Reactions  . Cephalexin Rash     Current Outpatient Prescriptions on File Prior to Visit  Medication Sig Dispense Refill  . ALPRAZolam (XANAX) 0.5 MG tablet Take 1 tablet (0.5 mg total) by mouth at bedtime as needed. PATIENT NEEDS OFFICE VISIT FOR ADDITIONAL REFILLS 30 tablet 0  . aspirin 81 MG tablet Take 81 mg by mouth daily.    Marland Kitchen. atorvastatin (LIPITOR) 10 MG tablet Take 1 tablet (10 mg total) by mouth daily. NO MORE REFILLS WITHOUT OFFICE VISIT - 2ND NOTICE 15 tablet 0  . Fiber TABS 500 mg. Take one daily.    . fish oil-omega-3 fatty acids 1000 MG capsule Take 2 g by mouth daily.    Marland Kitchen. levothyroxine (SYNTHROID, LEVOTHROID) 112 MCG tablet Take 1 tablet (112 mcg total) by mouth daily. 30 tablet 11  . losartan (COZAAR) 25 MG tablet Take 1 tablet (25 mg total) by mouth daily. 30 tablet 11  . sertraline (ZOLOFT) 100 MG tablet Take 2 tabs by mouth daily. 60 tablet 0  . traZODone (DESYREL) 50 MG tablet Take 1 tablet (50 mg total) by mouth at bedtime as needed. NO MORE REFILLS WITHOUT OFFICE VISIT - 2ND NOTICE 15  tablet 0  . triamcinolone cream (KENALOG) 0.1 % Apply topically 2 (two) times daily. 30 g 0   No current facility-administered medications on file prior to visit.     Past Medical History  Diagnosis Date  . Pure hypercholesterolemia   . Chest pain, unspecified   . Postmenopausal bleeding   . Abdominal pain, unspecified site   . Chest pain, unspecified   . Anxiety state, unspecified   . Depressive disorder, not elsewhere classified   . Unspecified hypothyroidism   . Insomnia, unspecified   . Heart murmur     hx  . Hypertension      Past Surgical History  Procedure Laterality Date  . Cesarean section      x 2  . Dilation and curettage of uterus    . Arm surgery      x 4 [facility]  . Abdominal hysterectomy  04/16/2012    one ovary resected.  No malignancy.  Post-menopausal bleeding.  [facility].  . Colonoscopy  08/16/2009    normal.  [facility].  Repeat in 10 years.     Family History  Problem Relation Age of Onset  . Heart disease Brother     from drug abuse  . Alcohol abuse Brother   . Cirrhosis Brother   . COPD Mother   . Heart disease Mother     CHF  . Heart failure Mother   .  Cancer Father     lung, prostate  . Hyperlipidemia Father   . Heart disease Father 81    CABG  . Ulcers Brother   . Pulmonary fibrosis Mother     died in Dec 29, 2007     History   Social History  . Marital Status: Married    Spouse Name: N/A  . Number of Children: 2  . Years of Education: 12   Occupational History  . disabled     due to arm surgery   Social History Main Topics  . Smoking status: Never Smoker   . Smokeless tobacco: Never Used  . Alcohol Use: No  . Drug Use: No  . Sexual Activity: Not on file   Other Topics Concern  . Not on file   Social History Narrative   Sexual activity:  Not sexually active.       Seatbelt:  Always uses seat belts.       Smoke alarm and carbon monoxide detector in the home.       Guns: Has  no unsecured guns in the home.       Caffeine:  Does not consume any caffeine.       Exercise: walks daily for 30 minutes walks 1-2 miles about every other day. Has dog.       Marital status:  Married x 41 years; moderately happy; married; no abuse;       Children:  2 daughters, 3 grandchildren/sons.       Organ donor: Yes.       Living Will: Patient does not have living will, DOES have HCPOA.      Employment: disability 12/28/2008 for [employer] comp injury arm.      PHYSICAL EXAM   BP 128/60 mmHg  Pulse 67  Ht  (1.6 m)  Wt 249 lb 4 oz (113.059 kg)  BMI 44.16 kg/m2 Constitutional: She is oriented to person, place, and time. She appears well-developed and well-nourished. No distress.  HENT: No nasal discharge.  Head: Normocephalic and atraumatic.  Eyes: Pupils are equal and round. Right eye exhibits no discharge. Left eye exhibits no discharge.  Neck: Normal range of motion. Neck supple. No JVD present. No thyromegaly present.  Cardiovascular: Normal rate, regular rhythm, normal heart sounds. Exam reveals no gallop and no friction rub. There is a 1/6 systolic ejection murmur at the aortic area  Pulmonary/Chest: Effort normal and breath sounds normal. No stridor. No respiratory distress. She has no wheezes. She has no rales. She exhibits no tenderness.  Abdominal: Soft. Bowel sounds are normal. She exhibits no distension. There is no tenderness. There is no rebound and no guarding.  Musculoskeletal: Normal range of motion. She exhibits no edema and no tenderness.  Neurological: She is alert and oriented to person, place, and time. Coordination normal.  Skin: Skin is warm and dry. No rash noted. She is not diaphoretic. No erythema. No pallor.  Psychiatric: She has a normal mood and affect. Her behavior is normal. Judgment and thought content normal.   EKG: Sinus  Rhythm  WITHIN NORMAL LIMITS   ASSESSMENT AND PLAN

## 2014-10-18 ENCOUNTER — Encounter: Payer: Self-pay | Admitting: Cardiovascular Disease

## 2014-10-18 NOTE — Assessment & Plan Note (Signed)
Her symptoms seems to be stable with no significant worsening. Given previous small ASD, I requested an echocardiogram to follow-up on this.

## 2014-10-18 NOTE — Assessment & Plan Note (Addendum)
Blood pressure is well-controlled on losartan.  

## 2014-11-04 ENCOUNTER — Other Ambulatory Visit: Payer: Self-pay

## 2014-11-04 ENCOUNTER — Other Ambulatory Visit (INDEPENDENT_AMBULATORY_CARE_PROVIDER_SITE_OTHER): Payer: Medicare Other

## 2014-11-04 DIAGNOSIS — R0602 Shortness of breath: Secondary | ICD-10-CM

## 2014-11-11 ENCOUNTER — Ambulatory Visit (INDEPENDENT_AMBULATORY_CARE_PROVIDER_SITE_OTHER): Payer: Medicare Other | Admitting: Family Medicine

## 2014-11-11 ENCOUNTER — Encounter: Payer: Self-pay | Admitting: Family Medicine

## 2014-11-11 VITALS — BP 136/59 | HR 63 | Temp 97.5°F | Resp 16 | Ht 63.0 in | Wt 242.6 lb

## 2014-11-11 DIAGNOSIS — R5383 Other fatigue: Secondary | ICD-10-CM | POA: Diagnosis not present

## 2014-11-11 DIAGNOSIS — E7800 Pure hypercholesterolemia, unspecified: Secondary | ICD-10-CM

## 2014-11-11 DIAGNOSIS — E038 Other specified hypothyroidism: Secondary | ICD-10-CM | POA: Diagnosis not present

## 2014-11-11 DIAGNOSIS — I10 Essential (primary) hypertension: Secondary | ICD-10-CM | POA: Diagnosis not present

## 2014-11-11 DIAGNOSIS — R7302 Impaired glucose tolerance (oral): Secondary | ICD-10-CM | POA: Diagnosis not present

## 2014-11-11 DIAGNOSIS — E785 Hyperlipidemia, unspecified: Secondary | ICD-10-CM

## 2014-11-11 DIAGNOSIS — F329 Major depressive disorder, single episode, unspecified: Secondary | ICD-10-CM | POA: Diagnosis not present

## 2014-11-11 DIAGNOSIS — F418 Other specified anxiety disorders: Secondary | ICD-10-CM

## 2014-11-11 DIAGNOSIS — G4700 Insomnia, unspecified: Secondary | ICD-10-CM

## 2014-11-11 DIAGNOSIS — E034 Atrophy of thyroid (acquired): Secondary | ICD-10-CM | POA: Diagnosis not present

## 2014-11-11 DIAGNOSIS — F32A Depression, unspecified: Secondary | ICD-10-CM

## 2014-11-11 DIAGNOSIS — E669 Obesity, unspecified: Secondary | ICD-10-CM

## 2014-11-11 DIAGNOSIS — F419 Anxiety disorder, unspecified: Secondary | ICD-10-CM

## 2014-11-11 LAB — LIPID PANEL
Cholesterol: 147 mg/dL (ref 0–200)
HDL: 49 mg/dL (ref >=46)
LDL CALC: 82 mg/dL (ref 0–99)
Total CHOL/HDL Ratio: 3 Ratio
Triglycerides: 80 mg/dL (ref <150)
VLDL: 16 mg/dL (ref 0–40)

## 2014-11-11 LAB — CBC WITH DIFFERENTIAL/PLATELET
BASOS ABS: 0.1 10*3/uL (ref 0.0–0.1)
Basophils Relative: 1 % (ref 0–1)
Eosinophils Absolute: 0.1 10*3/uL (ref 0.0–0.7)
Eosinophils Relative: 2 % (ref 0–5)
HCT: 36.5 % (ref 36.0–46.0)
Hemoglobin: 12.5 g/dL (ref 12.0–15.0)
Lymphocytes Relative: 40 % (ref 12–46)
Lymphs Abs: 2.6 10*3/uL (ref 0.7–4.0)
MCH: 31.7 pg (ref 26.0–34.0)
MCHC: 34.2 g/dL (ref 30.0–36.0)
MCV: 92.6 fL (ref 78.0–100.0)
MPV: 9.8 fL (ref 8.6–12.4)
Monocytes Absolute: 0.4 10*3/uL (ref 0.1–1.0)
Monocytes Relative: 7 % (ref 3–12)
NEUTROS ABS: 3.2 10*3/uL (ref 1.7–7.7)
NEUTROS PCT: 50 % (ref 43–77)
Platelets: 246 10*3/uL (ref 150–400)
RBC: 3.94 MIL/uL (ref 3.87–5.11)
RDW: 13.4 % (ref 11.5–15.5)
WBC: 6.4 10*3/uL (ref 4.0–10.5)

## 2014-11-11 LAB — POCT URINALYSIS DIPSTICK
Bilirubin, UA: NEGATIVE
GLUCOSE UA: NEGATIVE
Ketones, UA: NEGATIVE
NITRITE UA: NEGATIVE
Protein, UA: NEGATIVE
Spec Grav, UA: 1.01
Urobilinogen, UA: 0.2
pH, UA: 5.5

## 2014-11-11 LAB — COMPREHENSIVE METABOLIC PANEL
ALK PHOS: 68 U/L (ref 39–117)
ALT: 13 U/L (ref 0–35)
AST: 15 U/L (ref 0–37)
Albumin: 4.4 g/dL (ref 3.5–5.2)
BILIRUBIN TOTAL: 0.5 mg/dL (ref 0.2–1.2)
BUN: 17 mg/dL (ref 6–23)
CO2: 27 meq/L (ref 19–32)
Calcium: 9.9 mg/dL (ref 8.4–10.5)
Chloride: 105 mEq/L (ref 96–112)
Creat: 0.86 mg/dL (ref 0.50–1.10)
GLUCOSE: 98 mg/dL (ref 70–99)
Potassium: 4.2 mEq/L (ref 3.5–5.3)
SODIUM: 140 meq/L (ref 135–145)
Total Protein: 7 g/dL (ref 6.0–8.3)

## 2014-11-11 LAB — MICROALBUMIN, URINE: Microalb, Ur: 0.5 mg/dL (ref <2.0)

## 2014-11-11 MED ORDER — TRAZODONE HCL 50 MG PO TABS: 50.0000 mg | ORAL_TABLET | Freq: Every evening | ORAL | Status: DC | PRN

## 2014-11-11 MED ORDER — LOSARTAN POTASSIUM 25 MG PO TABS: 25.0000 mg | ORAL_TABLET | Freq: Every day | ORAL | Status: DC

## 2014-11-11 MED ORDER — BUPROPION HCL ER (XL) 150 MG PO TB24: 150.0000 mg | ORAL_TABLET | Freq: Every day | ORAL | Status: DC

## 2014-11-11 MED ORDER — ALPRAZOLAM 0.5 MG PO TABS: 0.5000 mg | ORAL_TABLET | Freq: Every evening | ORAL | Status: DC | PRN

## 2014-11-11 MED ORDER — LEVOTHYROXINE SODIUM 112 MCG PO TABS: 112.0000 ug | ORAL_TABLET | Freq: Every day | ORAL | Status: DC

## 2014-11-11 MED ORDER — ATORVASTATIN CALCIUM 10 MG PO TABS: 10.0000 mg | ORAL_TABLET | Freq: Every day | ORAL | Status: DC

## 2014-11-11 MED ORDER — SERTRALINE HCL 100 MG PO TABS
ORAL_TABLET | ORAL | Status: DC
Start: 2014-11-11 — End: 2015-04-28

## 2014-11-11 NOTE — Progress Notes (Signed)
Subjective:    Patient ID: Carolyn Campos, female    DOB: 03-06-1951, 64 y.o.   MRN: [id number]  11/11/2014  Medication Refill; Depression; Hyperlipidemia; and Hypertension   HPI This 64 y.o. female presents for nine month follow-up:  1.  HTN:  Patient reports good compliance with medication, good tolerance to medication, and good symptom control.  Does not check BP at home. Denies CP/palp/SOB/leg swelling.  2. Hypercholesterolemia: Patient reports good compliance with medication, good tolerance to medication, and good symptom control.  Denies HA/dizziness/focal weakness.   3.  Glucose Intolerance:  No weight loss since last visit; non-compliant with low-sugar food choices. Eats a lot of chocolate throughout the day.    4.  Anxiety and depression: April moved back home; her and Madelaine Bhatdam broke up.  April does not help out.  Not worth getting mad about it.  Brought dog with her; then bough another dog.  Zane is going to stay; Zane does not like new boyfriend.  Husband is in poor health.  Worries about [employer].  Crystal is serious and means business.  Wish she doesn't care.  Worried about dad; dad is 63 years old. Stopped taking Wellbutrin because forgetting to take Wellbutrin.  Taking Zoloft two tablets daily.   5. Hypothyroidism:  Patient reports good compliance with medication, good tolerance to medication, and good symptom control.  +excessive fatigue; +decreased motivation.  +tired all the time.    6.  Obesity: interested in [facility].  S/p pulmonology evaluation; no pulmonary process identified.  Not exercising much.  So tired all the time.  Have not discussed need for sleep study.  Refreshed every morning.  When sits down, so tired.  Takes a nap; getting lazy.  No energy.  Must push self all the time.   B: cheerios, glass of orange juice, water; no coffee. Snack:  Candy chocolate or little debbie cakes. Lunch:  Sandwich or vegetables, water Snack:  Candy bar or ice cream cone Supper:   Vegetables, meat or sandwiches, water Snack:    Review of Systems  Constitutional: Positive for fatigue. Negative for fever, chills and diaphoresis.  Eyes: Negative for visual disturbance.  Respiratory: Negative for cough and shortness of breath.   Cardiovascular: Negative for chest pain, palpitations and leg swelling.  Gastrointestinal: Negative for nausea, vomiting, abdominal pain, diarrhea and constipation.  Endocrine: Negative for cold intolerance, heat intolerance, polydipsia, polyphagia and polyuria.  Skin: Negative for rash.  Neurological: Negative for dizziness, tremors, seizures, syncope, facial asymmetry, speech difficulty, weakness, light-headedness, numbness and headaches.  Psychiatric/Behavioral: Positive for dysphoric mood. Negative for suicidal ideas, sleep disturbance and self-injury. The patient is nervous/anxious.     Past Medical History  Diagnosis Date  . Pure hypercholesterolemia   . Chest pain, unspecified   . Postmenopausal bleeding   . Abdominal pain, unspecified site   . Chest pain, unspecified   . Anxiety state, unspecified   . Depressive disorder, not elsewhere classified   . Unspecified hypothyroidism   . Insomnia, unspecified   . Heart murmur     hx  . Hypertension    Past Surgical History  Procedure Laterality Date  . Cesarean section      x 2  . Dilation and curettage of uterus    . Arm surgery      [facility] 4 [facility]  . Abdominal hysterectomy  04/16/2012    one ovary resected.  No malignancy.  Post-menopausal bleeding.  [facility].  .Marland Kitchen  Colonoscopy  08/16/2009    normal.  [facility].  Repeat in 10 years.   Allergies  Allergen Reactions  . Cephalexin Rash   Current Outpatient Prescriptions  Medication Sig Dispense Refill  . ALPRAZolam (XANAX) 0.5 MG tablet Take 1 tablet (0.5 mg total) by mouth at bedtime as needed. 30 tablet 5  . aspirin 81 MG tablet Take 81 mg by mouth daily.    Marland Kitchen atorvastatin  (LIPITOR) 10 MG tablet Take 1 tablet (10 mg total) by mouth daily. 30 tablet 5  . buPROPion (WELLBUTRIN XL) 150 MG 24 hr tablet Take 1 tablet (150 mg total) by mouth daily. 30 tablet 5  . Fiber TABS 500 mg. Take one daily.    . fish oil-omega-3 fatty acids 1000 MG capsule Take 2 g by mouth daily.    Marland Kitchen levothyroxine (SYNTHROID, LEVOTHROID) 112 MCG tablet Take 1 tablet (112 mcg total) by mouth daily. 30 tablet 5  . losartan (COZAAR) 25 MG tablet Take 1 tablet (25 mg total) by mouth daily. 30 tablet 5  . sertraline (ZOLOFT) 100 MG tablet Take 1-2 tabs by mouth daily. 60 tablet 5  . traZODone (DESYREL) 50 MG tablet Take 1 tablet (50 mg total) by mouth at bedtime as needed. NO MORE REFILLS WITHOUT OFFICE VISIT - 2ND NOTICE 30 tablet 5  . triamcinolone cream (KENALOG) 0.1 % Apply topically 2 (two) times daily. 30 g 0   No current facility-administered medications for this visit.       Objective:    BP 136/59 mmHg  Pulse 63  Temp(Src) 97.5 F (36.4 C) (Oral)  Resp 16  Ht  (1.6 m)  Wt 242 lb 9.6 oz (110.043 kg)  BMI 42.99 kg/m2  SpO2 93% Physical Exam  Constitutional: She is oriented to person, place, and time. She appears well-developed and well-nourished. No distress.  obese  HENT:  Head: Normocephalic and atraumatic.  Right Ear: External ear normal.  Left Ear: External ear normal.  Nose: Nose normal.  Mouth/Throat: Oropharynx is clear and moist.  Eyes: Conjunctivae and EOM are normal. Pupils are equal, round, and reactive to light.  Neck: Normal range of motion. Neck supple. Carotid bruit is not present. No thyromegaly present.  Cardiovascular: Normal rate, regular rhythm, normal heart sounds and intact distal pulses.  Exam reveals no gallop and no friction rub.   No murmur heard. Pulmonary/Chest: Effort normal and breath sounds normal. She has no wheezes. She has no rales.  Abdominal: Soft. Bowel sounds are normal. She exhibits no distension and no mass. There is no  tenderness. There is no rebound and no guarding.  Lymphadenopathy:    She has no cervical adenopathy.  Neurological: She is alert and oriented to person, place, and time. No cranial nerve deficit.  Skin: Skin is warm and dry. No rash noted. She is not diaphoretic. No erythema. No pallor.  Psychiatric: She has a normal mood and affect. Her behavior is normal. Judgment and thought content normal.  tearful   Results for orders placed or performed in visit on 11/11/14  Microalbumin, urine  Result Value Ref Range   Microalb, Ur 0.5 <2.0 mg/dL  POCT urinalysis dipstick  Result Value Ref Range   Color, UA yellow    Clarity, UA clear    Glucose, UA neg    Bilirubin, UA neg    Ketones, UA neg    Spec Grav, UA 1.010    Blood, UA trace    pH, UA 5.5    Protein,  UA neg    Urobilinogen, UA 0.2    Nitrite, UA neg    Leukocytes, UA small (1+)        Assessment & Plan:   1. Essential hypertension, benign   2. Hyperlipidemia   3. Glucose intolerance (impaired glucose tolerance)   4. Hypothyroidism due to acquired atrophy of thyroid   5. Pure hypercholesterolemia   6. Depression   7. Insomnia   8. Anxiety and depression   9. Fatigue due to depression   10. Obesity (BMI 30.0-34.9)     1. HTN: controlled; obtain labs; refill on Losartan  daily provided. 2.  Hypercholesterolemia: controlled; obtain labs; refill provided. 3.  Glucose Intolerance: persistent; non-compliant with diet, exercise, dietary modification. Obtain labs. 4.  Hypothyroidism: stable; obtain labs; refill provided. 5.  Depression and anxiety: worsening due to multiple family stressors; pt declined psychotherapy; decrease Zoloft to  daily; add Wellbutrin XL  daily.  Refill of Xanax provided.  If no improvement in fatigue with addition of Wellbutrin, refer for sleep study. 6. Fatigue: New: obtain labs; s/p recent echo; s/p recent pulmonology follow-up; if no improvement with treatment of depression and  anxiety, refer for sleep study. 7. Obesity: uncontrolled; highly recommend weight loss, exercise, low-fat and low-sugar diet.    Meds ordered this encounter  Medications  . buPROPion (WELLBUTRIN XL) 150 MG 24 hr tablet    Sig: Take 1 tablet (150 mg total) by mouth daily.    Dispense:  30 tablet    Refill:  5  . atorvastatin (LIPITOR) 10 MG tablet    Sig: Take 1 tablet (10 mg total) by mouth daily.    Dispense:  30 tablet    Refill:  5  . losartan (COZAAR) 25 MG tablet    Sig: Take 1 tablet (25 mg total) by mouth daily.    Dispense:  30 tablet    Refill:  5  . sertraline (ZOLOFT) 100 MG tablet    Sig: Take 1-2 tabs by mouth daily.    Dispense:  60 tablet    Refill:  5  . traZODone (DESYREL) 50 MG tablet    Sig: Take 1 tablet (50 mg total) by mouth at bedtime as needed. NO MORE REFILLS WITHOUT OFFICE VISIT - 2ND NOTICE    Dispense:  30 tablet    Refill:  5  . levothyroxine (SYNTHROID, LEVOTHROID) 112 MCG tablet    Sig: Take 1 tablet (112 mcg total) by mouth daily.    Dispense:  30 tablet    Refill:  5  . ALPRAZolam (XANAX) 0.5 MG tablet    Sig: Take 1 tablet (0.5 mg total) by mouth at bedtime as needed.    Dispense:  30 tablet    Refill:  5    Return in about 6 months (around 05/14/2015) for recheck.     Mylon Mabey Paulita Fujita, M.D. [facility] [address] [place], [place]  [address] [phone] phone [phone] fax

## 2014-11-12 LAB — HEMOGLOBIN A1C
Hgb A1c MFr Bld: 6.7 % — ABNORMAL HIGH (ref <5.7)
MEAN PLASMA GLUCOSE: 146 mg/dL — AB (ref <117)

## 2014-11-12 LAB — T4, FREE: Free T4: 1.02 ng/dL (ref 0.80–1.80)

## 2014-11-12 LAB — TSH: TSH: 2.326 u[IU]/mL (ref 0.350–4.500)

## 2014-11-21 ENCOUNTER — Telehealth: Payer: Self-pay

## 2014-11-21 NOTE — Telephone Encounter (Signed)
Pt would like echo results 

## 2014-11-21 NOTE — Telephone Encounter (Signed)
In Dr. Jari SportsmanArida's basket for review.

## 2014-12-03 NOTE — Op Note (Signed)
PATIENT NAME:  Carolyn Campos, PROVENCAL MR#:  [id number] DATE OF BIRTH:  [birth year]  DATE OF PROCEDURE:  05/04/2012  PREOPERATIVE DIAGNOSIS: Postmenopausal bleeding, stress urinary incontinence.   POSTOPERATIVE DIAGNOSIS:  Postmenopausal bleeding, stress urinary incontinence.   PROCEDURE: Total vaginal hysterectomy, vaginal sling procedure for incontinence, cystoscopy.   SURGEON: Annamarie Major, M.D.   ASSISTANT: Clipp   ANESTHESIA: General.   ESTIMATED BLOOD LOSS: 250 mL.   COMPLICATIONS: Small pinhole-sized cystotomy that is repaired at the time of surgery.   FINDINGS: Normal size uterus with good support. Ovaries are high and are not obtainable. The patient has a minimal cystocele. At the time of cystoscopy the repair of the bladder is intact and ureters show bilateral flow. There is no injury to bladder with the vaginal sling device or material.   DISPOSITION: To the recovery room in stable condition.   TECHNIQUE: The patient is prepped and draped in the usual sterile fashion after adequate anesthesia is obtained in the dorsal lithotomy position. A Foley catheter is inserted. A speculum is placed and the cervix is grasped with a tenaculum. The cervix circumference is then infiltrated with 1% lidocaine with epinephrine and then a circumferential incision is made with Bovie electrocautery. Metzenbaum scissors were then used to dissect the endopelvic fascia, the posterior peritoneum is penetrated, and a long weighted retractor is placed. The uterosacral ligaments are clamped, transected, and suture ligated and then sutured to the vaginal cuff. The uterine arteries are clamped, transected, and suture ligated. Anterior peritoneum is penetrated and a retractor is placed. The broad ligaments on either side of the uterus are carefully clamped, transected, and suture ligated until the area of the cornua is reached. The cornua is crossclamped with careful inspection that no bowel or other structures are within  the clamps, and then the uterus is amputated. These pedicles are also suture ligated and excellent hemostasis is noted.   The peritoneum is identified. In the preparation with the needle to suture the peritoneum closed, a needle hole is placed into the bladder with urine visualized back-flowing into the vaginal area. This needle-hole size cystotomy is then closed with a 2-0 Vicryl suture. Careful mobilization of tissues to oversew this area is performed. The peritoneum is then identified and a suture is placed through this and then a pursestring closure is performed of the peritoneum to include  the lateral vaginal walls and the posterior peritoneum. The uterosacral ligaments are also plicated with an Ethibond suture at this time, and after both sutures are sutured shut the peritoneum is completely closed. Excellent hemostasis is noted.   A small incision is made in the midline anterior vaginal wall. No anterior colporrhaphy is needed to be performed due to good support and minimal cystocele. A 1-cm incision is made and the edges are grasped with Allis clamps. The endopelvic fascia is dissected away from the vaginal mucosa and dissected in the direction of the retropubic space. Infiltration with 1% lidocaine with epinephrine is performed in this area as well as in the mons pubis area, directed downward towards the pubic symphysis in the area where the tension-free vaginal tape will exit.  Once the dissection is complete, the tension-free vaginal tape applicators are then placed. Trocars are placed through the right and left retropubic space, while the bladder is deviated to the contralateral side with a rigid catheter guide. The trocars are guided under the undersurface of the pubic ramus superiorly, and once they tent the skin in the mons pubis, a small incision with  a scalpel is made and they are pulled through.   Once this is done bilaterally then cystoscopy is performed.  Saline is used as a fluid media  for cystoscopy. 200 mL is placed in the bladder. Careful inspection of all surfaces reveals no trocars or sling material within the bladder. The area where the cystotomy was performed can be identified with excellent closure and no bleeding and no through and through suture. The ureters are observed with urine flow through each ureteral orifice. Indigo carmine dye is injected with observation of blue dye. The cystoscope is removed with approximately 200 mL of fluid left in the bladder.   At this time the sling is pulled into a somewhat tight position underneath the bladder neck. Suprapubic pressure reveals leakage of urine and is tightened until minimal leakage is performed. A right angle clamp is able to be placed between the sling material and the vaginal tissues, and the sleeves of the sling are removed and it is cut off at the level of the skin. Skin is closed with Dermabond.   A plication suture is placed to oversew the mesh material from the sling. The vaginal cuff is then closed with a 2-0 Vicryl suture. Foley catheter is placed. The vaginal mucosa from the hysterectomy site is then closed with a running 2-0 Vicryl suture in a locking fashion with excellent hemostasis noted. The vaginal cavity is irrigated and a packing sponge with bacitracin ointment is then applied. Foley catheter is left in place and she goes to the recovery room in stable condition. All sponge, instrument, and needle counts are correct.      ____________________________ R. Annamarie MajorPaul Berlin Mokry, MD [id number]:bjt D: 05/04/2012 10:18:42 ET T: 05/04/2012 14:08:52 ET JOB#: [id number]  cc: Dierdre Searles. Paul Amely Voorheis, MD, <Dictator> Nadara MustardOBERT P Amori Colomb MD ELECTRONICALLY SIGNED 05/04/2012 17:58

## 2015-04-28 ENCOUNTER — Other Ambulatory Visit: Payer: Self-pay | Admitting: Family Medicine

## 2015-04-28 DIAGNOSIS — Z23 Encounter for immunization: Secondary | ICD-10-CM | POA: Diagnosis not present

## 2015-04-29 NOTE — Telephone Encounter (Signed)
Please call in refill of Xanax as approved; please advise pharmacist that pt is due for follow-up OV; cannot have further refills without appointment.

## 2015-05-01 NOTE — Telephone Encounter (Signed)
Called in w/message needs OV for more.

## 2015-06-16 ENCOUNTER — Encounter: Payer: Self-pay | Admitting: Family Medicine

## 2015-06-16 ENCOUNTER — Ambulatory Visit (INDEPENDENT_AMBULATORY_CARE_PROVIDER_SITE_OTHER): Payer: Medicare Other

## 2015-06-16 ENCOUNTER — Ambulatory Visit (INDEPENDENT_AMBULATORY_CARE_PROVIDER_SITE_OTHER): Payer: Medicare Other | Admitting: Family Medicine

## 2015-06-16 VITALS — BP 136/73 | HR 56 | Temp 98.0°F | Resp 16 | Ht 63.5 in | Wt 244.2 lb

## 2015-06-16 DIAGNOSIS — G4700 Insomnia, unspecified: Secondary | ICD-10-CM

## 2015-06-16 DIAGNOSIS — L509 Urticaria, unspecified: Secondary | ICD-10-CM

## 2015-06-16 DIAGNOSIS — Z1159 Encounter for screening for other viral diseases: Secondary | ICD-10-CM | POA: Diagnosis not present

## 2015-06-16 DIAGNOSIS — F329 Major depressive disorder, single episode, unspecified: Secondary | ICD-10-CM

## 2015-06-16 DIAGNOSIS — E034 Atrophy of thyroid (acquired): Secondary | ICD-10-CM

## 2015-06-16 DIAGNOSIS — R0609 Other forms of dyspnea: Secondary | ICD-10-CM | POA: Diagnosis not present

## 2015-06-16 DIAGNOSIS — E7800 Pure hypercholesterolemia, unspecified: Secondary | ICD-10-CM

## 2015-06-16 DIAGNOSIS — R7309 Other abnormal glucose: Secondary | ICD-10-CM | POA: Diagnosis not present

## 2015-06-16 DIAGNOSIS — I10 Essential (primary) hypertension: Secondary | ICD-10-CM | POA: Diagnosis not present

## 2015-06-16 DIAGNOSIS — E038 Other specified hypothyroidism: Secondary | ICD-10-CM

## 2015-06-16 DIAGNOSIS — F418 Other specified anxiety disorders: Secondary | ICD-10-CM | POA: Diagnosis not present

## 2015-06-16 DIAGNOSIS — F419 Anxiety disorder, unspecified: Secondary | ICD-10-CM

## 2015-06-16 DIAGNOSIS — Z1231 Encounter for screening mammogram for malignant neoplasm of breast: Secondary | ICD-10-CM | POA: Diagnosis not present

## 2015-06-16 DIAGNOSIS — E669 Obesity, unspecified: Secondary | ICD-10-CM | POA: Diagnosis not present

## 2015-06-16 DIAGNOSIS — Z114 Encounter for screening for human immunodeficiency virus [HIV]: Secondary | ICD-10-CM

## 2015-06-16 LAB — LIPID PANEL
Cholesterol: 154 mg/dL (ref 125–200)
HDL: 45 mg/dL — ABNORMAL LOW (ref >=46)
LDL CALC: 85 mg/dL (ref <130)
Total CHOL/HDL Ratio: 3.4 Ratio (ref <=5.0)
Triglycerides: 119 mg/dL (ref <150)
VLDL: 24 mg/dL (ref <30)

## 2015-06-16 LAB — CBC WITH DIFFERENTIAL/PLATELET
BASOS ABS: 0 10*3/uL (ref 0.0–0.1)
BASOS PCT: 0 % (ref 0–1)
EOS ABS: 0.1 10*3/uL (ref 0.0–0.7)
Eosinophils Relative: 2 % (ref 0–5)
HCT: 35.6 % — ABNORMAL LOW (ref 36.0–46.0)
Hemoglobin: 12 g/dL (ref 12.0–15.0)
LYMPHS ABS: 2.7 10*3/uL (ref 0.7–4.0)
Lymphocytes Relative: 44 % (ref 12–46)
MCH: 31.7 pg (ref 26.0–34.0)
MCHC: 33.7 g/dL (ref 30.0–36.0)
MCV: 93.9 fL (ref 78.0–100.0)
MPV: 9.8 fL (ref 8.6–12.4)
Monocytes Absolute: 0.4 10*3/uL (ref 0.1–1.0)
Monocytes Relative: 6 % (ref 3–12)
NEUTROS PCT: 48 % (ref 43–77)
Neutro Abs: 3 10*3/uL (ref 1.7–7.7)
PLATELETS: 252 10*3/uL (ref 150–400)
RBC: 3.79 MIL/uL — AB (ref 3.87–5.11)
RDW: 13.1 % (ref 11.5–15.5)
WBC: 6.2 10*3/uL (ref 4.0–10.5)

## 2015-06-16 LAB — HEMOGLOBIN A1C
HEMOGLOBIN A1C: 6.1 % — AB (ref <5.7)
Mean Plasma Glucose: 128 mg/dL — ABNORMAL HIGH (ref <117)

## 2015-06-16 LAB — COMPREHENSIVE METABOLIC PANEL
ALK PHOS: 76 U/L (ref 33–130)
ALT: 13 U/L (ref 6–29)
AST: 14 U/L (ref 10–35)
Albumin: 4.4 g/dL (ref 3.6–5.1)
BILIRUBIN TOTAL: 0.4 mg/dL (ref 0.2–1.2)
BUN: 13 mg/dL (ref 7–25)
CO2: 26 mmol/L (ref 20–31)
CREATININE: 0.82 mg/dL (ref 0.50–0.99)
Calcium: 9.4 mg/dL (ref 8.6–10.4)
Chloride: 105 mmol/L (ref 98–110)
GLUCOSE: 105 mg/dL — AB (ref 65–99)
Potassium: 4.1 mmol/L (ref 3.5–5.3)
SODIUM: 139 mmol/L (ref 135–146)
TOTAL PROTEIN: 6.9 g/dL (ref 6.1–8.1)

## 2015-06-16 LAB — T4, FREE: Free T4: 0.88 ng/dL (ref 0.80–1.80)

## 2015-06-16 LAB — HEPATITIS C ANTIBODY: HCV AB: NEGATIVE

## 2015-06-16 LAB — TSH: TSH: 1.73 u[IU]/mL (ref 0.350–4.500)

## 2015-06-16 MED ORDER — SERTRALINE HCL 100 MG PO TABS: ORAL_TABLET | ORAL | Status: DC

## 2015-06-16 MED ORDER — TRAZODONE HCL 50 MG PO TABS: 50.0000 mg | ORAL_TABLET | Freq: Every evening | ORAL | Status: DC | PRN

## 2015-06-16 MED ORDER — LOSARTAN POTASSIUM 25 MG PO TABS: 25.0000 mg | ORAL_TABLET | Freq: Every day | ORAL | Status: DC

## 2015-06-16 MED ORDER — ATORVASTATIN CALCIUM 10 MG PO TABS: ORAL_TABLET | ORAL | Status: DC

## 2015-06-16 MED ORDER — BUPROPION HCL ER (XL) 300 MG PO TB24: 300.0000 mg | ORAL_TABLET | Freq: Every day | ORAL | Status: DC

## 2015-06-16 MED ORDER — LEVOTHYROXINE SODIUM 112 MCG PO TABS: 112.0000 ug | ORAL_TABLET | Freq: Every day | ORAL | Status: DC

## 2015-06-16 NOTE — Progress Notes (Signed)
Subjective:    Patient ID: Carolyn Campos, female    DOB: Sep 29, 1950, 64 y.o.   MRN: [id number]  06/16/2015  Medication Refill and Depression   HPI This 64 y.o. female presents for seven month follow-up:   1.  HTN: Patient reports good compliance with medication, good tolerance to medication, and good symptom control.    2. Glucose Intolerance:  Has not lost any weight; exercising sporadically.  3.  Hypothyroidism: Patient reports good compliance with medication, good tolerance to medication, and good symptom control.     4.  Depression with anxiety: husband is sick.  April is not doing well; raising grandson.  April broke up with Quita Skye; now dating another guy; now grandson doesn't count.  Daughter neglecting grandson.  Angry with daughter. Lonely.  Has SI but would never carry out because of Zane.  Enis Slipper is 63 years old; must pick him up everyday.  April staying with new boyfriend.  Never cries.  Crying in office.  Feels not good enough to be around people.  Stopped going to church.  Doesn't want to be around people.  Not mad at husband. Has been through counseling in the past; last counseling several years ago.  This one counselor retired and she told patient to put thoughts in back of mind.  So much going on with father.  Father is a Buyer, retail; life sucked growing up.  Cannot take care of father if something happens to him.  Zoloft 259m daily.  Wellbutrin XL 1547mdaily.    5.  Insomnia:  Taking Xanax qhs; sometimes takes 1/2 during the day; Trazodone 5070mhs.    6.  SOB: seems different.  Bending over with SOB.  If carries clothes to laundry room, gets SOB.  Little exercise; walking with dog 15 minutes or 30 minutes slow and rapid; up hills, really suffers with SOB.  Discussed pulmonary fibrosis with mother.  s/p cardiology evaluation; s/p pulmonology evaluation.  Follow-up with cardiology in 10/2015. S/p stress testing by [facility]/p echo. S/p PFTs S/p CXR in 2014.  7. Hyperlipidemia:  Patient reports good compliance with medication, good tolerance to medication, and good symptom control.      Review of Systems  Constitutional: Negative for fever, chills, diaphoresis and fatigue.  Eyes: Negative for visual disturbance.  Respiratory: Negative for cough and shortness of breath.   Cardiovascular: Negative for chest pain, palpitations and leg swelling.  Gastrointestinal: Negative for nausea, vomiting, abdominal pain, diarrhea and constipation.  Endocrine: Negative for cold intolerance, heat intolerance, polydipsia, polyphagia and polyuria.  Neurological: Negative for dizziness, tremors, seizures, syncope, facial asymmetry, speech difficulty, weakness, light-headedness, numbness and headaches.    Past Medical History  Diagnosis Date  . Pure hypercholesterolemia   . Chest pain, unspecified   . Postmenopausal bleeding   . Abdominal pain, unspecified site   . Chest pain, unspecified   . Anxiety state, unspecified   . Depressive disorder, not elsewhere classified   . Unspecified hypothyroidism   . Insomnia, unspecified   . Heart murmur     hx  . Hypertension    Past Surgical History  Procedure Laterality Date  . Cesarean section      x 2  . Dilation and curettage of uterus    . Arm surgery      x 4 post accident at work, [facility]  [facility]    one ovary resected.  No malignancy.  Post-menopausal bleeding.  [facility].  .Marland Kitchen  Colonoscopy  08/16/2009    normal.  [facility].  Repeat in 10 years.   Allergies  Allergen Reactions  . Cephalexin Rash   Current Outpatient Prescriptions  Medication Sig Dispense Refill  . ALPRAZolam (XANAX) 0.5 MG tablet TAKE 1 TABLET AT BEDTIME AS NEEDED 30 tablet 0  . aspirin 81 MG tablet Take 81 mg by mouth daily.    Marland Kitchen atorvastatin (LIPITOR) 10 MG tablet TAKE ONE (1) TABLET BY MOUTH ONCE DAILY  "OV NEEDED FOR REFILLS" 30 tablet 5  . buPROPion (WELLBUTRIN XL) 300 MG 24 hr tablet Take 1  tablet (300 mg total) by mouth daily. 30 tablet 5  . Fiber TABS 500 mg. Take one daily.    . fish oil-omega-3 fatty acids 1000 MG capsule Take 2 g by mouth daily.    Marland Kitchen levothyroxine (SYNTHROID, LEVOTHROID) 112 MCG tablet Take 1 tablet (112 mcg total) by mouth daily. 30 tablet 5  . losartan (COZAAR) 25 MG tablet Take 1 tablet (25 mg total) by mouth daily. 30 tablet 5  . sertraline (ZOLOFT) 100 MG tablet 1 tablet daily 30 tablet 5  . traZODone (DESYREL) 50 MG tablet Take 1 tablet (50 mg total) by mouth at bedtime as needed. 30 tablet 5  . triamcinolone cream (KENALOG) 0.1 % Apply topically 2 (two) times daily. 30 g 0   No current facility-administered medications for this visit.   Social History   Social History  . Marital Status: Married    Spouse Name: N/A  . Number of Children: 2  . Years of Education: 12   Occupational History  . disabled     due to arm surgery   Social History Main Topics  . Smoking status: Never Smoker   . Smokeless tobacco: Never Used  . Alcohol Use: No  . Drug Use: No  . Sexual Activity: Not on file   Other Topics Concern  . Not on file   Social History Narrative   Sexual activity:  Not sexually active.       Seatbelt:  Always uses seat belts.       Smoke alarm and carbon monoxide detector in the home.       Guns: Has no unsecured guns in the home.       Caffeine:  Does not consume any caffeine.       Exercise: walks daily for 30 minutes walks 1-2 miles about every other day. Has dog.       Marital status:  Married x 41 years; moderately happy; married; no abuse;       Children:  2 daughters, 3 grandchildren/sons.       Organ donor: Yes.       Living Will: Patient does not have living will, DOES have HCPOA.      Employment: disability October 09, 2008 for [employer] comp injury arm.   Family History  Problem Relation Age of Onset  . Heart disease Brother     from drug abuse  . Alcohol abuse Brother   . Cirrhosis Brother   . COPD Mother   . Heart disease  Mother     CHF  . Heart failure Mother   . Cancer Father     lung, prostate  . Hyperlipidemia Father   . Heart disease Father 54    CABG  . Ulcers Brother   . Pulmonary fibrosis Mother     died in 10-10-07       Objective:    BP 136/73 mmHg  Pulse 56  Temp(Src) 98 F (36.7 C) (Oral)  Resp 16  Ht 5' 3.5" (1.613 m)  Wt 244 lb 3.2 oz (110.768 kg)  BMI 42.57 kg/m2 Physical Exam  Constitutional: She is oriented to person, place, and time. She appears well-developed and well-nourished. No distress.  HENT:  Head: Normocephalic and atraumatic.  Right Ear: External ear normal.  Left Ear: External ear normal.  Nose: Nose normal.  Mouth/Throat: Oropharynx is clear and moist.  Eyes: Conjunctivae and EOM are normal. Pupils are equal, round, and reactive to light.  Neck: Normal range of motion. Neck supple. Carotid bruit is not present. No thyromegaly present.  Cardiovascular: Normal rate, regular rhythm, normal heart sounds and intact distal pulses.  Exam reveals no gallop and no friction rub.   No murmur heard. Pulmonary/Chest: Effort normal and breath sounds normal. She has no wheezes. She has no rales.  Abdominal: Soft. Bowel sounds are normal. She exhibits no distension and no mass. There is no tenderness. There is no rebound and no guarding.  Lymphadenopathy:    She has no cervical adenopathy.  Neurological: She is alert and oriented to person, place, and time. No cranial nerve deficit.  Skin: Skin is warm and dry. No rash noted. She is not diaphoretic. No erythema. No pallor.  Psychiatric: She has a normal mood and affect. Her behavior is normal.   Results for orders placed or performed in visit on 11/11/14  CBC with Differential/Platelet  Result Value Ref Range   WBC 6.4 4.0 - 10.5 K/uL   RBC 3.94 3.87 - 5.11 MIL/uL   Hemoglobin 12.5 12.0 - 15.0 g/dL   HCT 36.5 36.0 - 46.0 %   MCV 92.6 78.0 - 100.0 fL   MCH 31.7 26.0 - 34.0 pg   MCHC 34.2 30.0 - 36.0 g/dL   RDW 13.4 11.5 -  15.5 %   Platelets 246 150 - 400 K/uL   MPV 9.8 8.6 - 12.4 fL   Neutrophils Relative % 50 43 - 77 %   Neutro Abs 3.2 1.7 - 7.7 K/uL   Lymphocytes Relative 40 12 - 46 %   Lymphs Abs 2.6 0.7 - 4.0 K/uL   Monocytes Relative 7 3 - 12 %   Monocytes Absolute 0.4 0.1 - 1.0 K/uL   Eosinophils Relative 2 0 - 5 %   Eosinophils Absolute 0.1 0.0 - 0.7 K/uL   Basophils Relative 1 0 - 1 %   Basophils Absolute 0.1 0.0 - 0.1 K/uL   Smear Review Criteria for review not met   Comprehensive metabolic panel  Result Value Ref Range   Sodium 140 135 - 145 mEq/L   Potassium 4.2 3.5 - 5.3 mEq/L   Chloride 105 96 - 112 mEq/L   CO2 27 19 - 32 mEq/L   Glucose, Bld 98 70 - 99 mg/dL   BUN 17 6 - 23 mg/dL   Creat 0.86 0.50 - 1.10 mg/dL   Total Bilirubin 0.5 0.2 - 1.2 mg/dL   Alkaline Phosphatase 68 39 - 117 U/L   AST 15 0 - 37 U/L   ALT 13 0 - 35 U/L   Total Protein 7.0 6.0 - 8.3 g/dL   Albumin 4.4 3.5 - 5.2 g/dL   Calcium 9.9 8.4 - 10.5 mg/dL  Hemoglobin A1c  Result Value Ref Range   Hgb A1c MFr Bld 6.7 (H) <5.7 %   Mean Plasma Glucose 146 (H) <117 mg/dL  Lipid panel  Result Value Ref Range   Cholesterol 147 0 -  200 mg/dL   Triglycerides 80 <150 mg/dL   HDL 49 >=46 mg/dL   Total CHOL/HDL Ratio 3.0 Ratio   VLDL 16 0 - 40 mg/dL   LDL Cholesterol 82 0 - 99 mg/dL  T4, free  Result Value Ref Range   Free T4 1.02 0.80 - 1.80 ng/dL  TSH  Result Value Ref Range   TSH 2.326 0.350 - 4.500 uIU/mL  Microalbumin, urine  Result Value Ref Range   Microalb, Ur 0.5 <2.0 mg/dL  POCT urinalysis dipstick  Result Value Ref Range   Color, UA yellow    Clarity, UA clear    Glucose, UA neg    Bilirubin, UA neg    Ketones, UA neg    Spec Grav, UA 1.010    Blood, UA trace    pH, UA 5.5    Protein, UA neg    Urobilinogen, UA 0.2    Nitrite, UA neg    Leukocytes, UA small (1+)    [facility] reading (PRIMARY) by  Dr. Tamala Julian.CXR: NAD      Assessment & Plan:   1. Urticaria   2. Hypothyroidism due to acquired  atrophy of thyroid   3. Pure hypercholesterolemia   4. Other abnormal glucose   5. Insomnia   6. Essential hypertension   7. Anxiety and depression   8. Need for hepatitis C screening test   9. Screening for HIV (human immunodeficiency virus)   10. Dyspnea on exertion   11. Encounter for screening mammogram for breast cancer   12. Obesity   13. Essential hypertension, benign     Orders Placed This Encounter  Procedures  . DG Chest 2 View    Standing Status: Future     Number of Occurrences: 1     Standing Expiration Date: 06/15/2016    Order Specific Question:  Reason for Exam (SYMPTOM  OR DIAGNOSIS REQUIRED)    Answer:  SOB/DOE; obesity    Order Specific Question:  Preferred imaging location?    Answer:  External  . MM Digital Screening    Standing Status: Future     Number of Occurrences:      Standing Expiration Date: 08/15/2016    Order Specific Question:  Reason for Exam (SYMPTOM  OR DIAGNOSIS REQUIRED)    Answer:  annual screening    Order Specific Question:  Preferred imaging location?    Answer:  [facility]  . CBC with Differential/Platelet  . Comprehensive metabolic panel    Order Specific Question:  Has the patient fasted?    Answer:  Yes  . Hemoglobin A1c  . Lipid panel    Order Specific Question:  Has the patient fasted?    Answer:  Yes  . TSH  . T4, free  . Hepatitis C antibody  . Ambulatory referral to Pulmonology    Referral Priority:  Routine    Referral Type:  Consultation    Referral Reason:  Specialty Services Required    Requested Specialty:  Pulmonary Disease    Number of Visits Requested:  1   Meds ordered this encounter  Medications  . sertraline (ZOLOFT) 100 MG tablet    Sig: 1 tablet daily    Dispense:  30 tablet    Refill:  5  . buPROPion (WELLBUTRIN XL) 300 MG 24 hr tablet    Sig: Take 1 tablet (300 mg total) by mouth daily.    Dispense:  30 tablet    Refill:  5  . atorvastatin (LIPITOR) 10 MG  tablet    Sig: TAKE ONE (1)  TABLET BY MOUTH ONCE DAILY  "OV NEEDED FOR REFILLS"    Dispense:  30 tablet    Refill:  5  . levothyroxine (SYNTHROID, LEVOTHROID) 112 MCG tablet    Sig: Take 1 tablet (112 mcg total) by mouth daily.    Dispense:  30 tablet    Refill:  5  . losartan (COZAAR) 25 MG tablet    Sig: Take 1 tablet (25 mg total) by mouth daily.    Dispense:  30 tablet    Refill:  5  . traZODone (DESYREL) 50 MG tablet    Sig: Take 1 tablet (50 mg total) by mouth at bedtime as needed.    Dispense:  30 tablet    Refill:  5    Return in about 3 months (around 09/16/2015) for recheck.    Kristi Elayne Guerin, M.D. [facility] [address] [place], [place]  [phone] phone [phone] fax

## 2015-06-18 ENCOUNTER — Other Ambulatory Visit: Payer: Self-pay

## 2015-06-18 DIAGNOSIS — Z1231 Encounter for screening mammogram for malignant neoplasm of breast: Secondary | ICD-10-CM

## 2015-06-24 ENCOUNTER — Other Ambulatory Visit: Payer: Self-pay | Admitting: Family Medicine

## 2015-06-24 NOTE — Telephone Encounter (Signed)
Rx faxed

## 2015-06-24 NOTE — Telephone Encounter (Signed)
Please call in or fax refill

## 2015-08-26 ENCOUNTER — Telehealth: Payer: Self-pay | Admitting: *Deleted

## 2015-08-26 ENCOUNTER — Other Ambulatory Visit: Payer: Self-pay | Admitting: Internal Medicine

## 2015-08-26 DIAGNOSIS — R0600 Dyspnea, unspecified: Secondary | ICD-10-CM

## 2015-08-26 NOTE — Telephone Encounter (Signed)
Orders for PFT/SMW and CXR placed per VM.

## 2015-09-02 ENCOUNTER — Ambulatory Visit: Payer: Medicare Other | Admitting: Internal Medicine

## 2015-09-03 ENCOUNTER — Ambulatory Visit
Admission: RE | Admit: 2015-09-03 | Discharge: 2015-09-03 | Disposition: A | Discharge status: Home / Self Care | Payer: Medicare Other | Source: Ambulatory Visit | Attending: Internal Medicine | Admitting: Internal Medicine

## 2015-09-03 ENCOUNTER — Ambulatory Visit (INDEPENDENT_AMBULATORY_CARE_PROVIDER_SITE_OTHER): Payer: Medicare Other | Admitting: *Deleted

## 2015-09-03 DIAGNOSIS — I700 Atherosclerosis of aorta: Secondary | ICD-10-CM | POA: Insufficient documentation

## 2015-09-03 DIAGNOSIS — R918 Other nonspecific abnormal finding of lung field: Secondary | ICD-10-CM | POA: Insufficient documentation

## 2015-09-03 DIAGNOSIS — R0600 Dyspnea, unspecified: Secondary | ICD-10-CM

## 2015-09-03 DIAGNOSIS — R0602 Shortness of breath: Secondary | ICD-10-CM | POA: Diagnosis not present

## 2015-09-03 LAB — PULMONARY FUNCTION TEST
DL/VA % pred: 98 %
DL/VA: 4.63 ml/min/mmHg/L
DLCO unc % pred: 74 %
DLCO unc: 17.1 ml/min/mmHg
FEF 25-75 POST: 3.48 L/s
FEF 25-75 Pre: 2.89 L/sec
FEF2575-%Change-Post: 20 %
FEF2575-%PRED-POST: 163 %
FEF2575-%PRED-PRE: 136 %
FEV1-%Change-Post: 3 %
FEV1-%PRED-PRE: 89 %
FEV1-%Pred-Post: 92 %
FEV1-POST: 2.17 L
FEV1-PRE: 2.1 L
FEV1FVC-%Change-Post: 0 %
FEV1FVC-%PRED-PRE: 114 %
FEV6-%Change-Post: 2 %
FEV6-%Pred-Post: 82 %
FEV6-%Pred-Pre: 80 %
FEV6-POST: 2.44 L
FEV6-Pre: 2.38 L
FEV6FVC-%PRED-POST: 104 %
FEV6FVC-%Pred-Pre: 104 %
FVC-%CHANGE-POST: 2 %
FVC-%PRED-POST: 79 %
FVC-%PRED-PRE: 77 %
FVC-POST: 2.44 L
FVC-PRE: 2.38 L
PRE FEV1/FVC RATIO: 88 %
Post FEV1/FVC ratio: 89 %
Post FEV6/FVC ratio: 100 %
Pre FEV6/FVC Ratio: 100 %

## 2015-09-03 NOTE — Progress Notes (Signed)
SMW performed today. 

## 2015-09-03 NOTE — Progress Notes (Signed)
PFT performed today. 

## 2015-09-10 ENCOUNTER — Encounter: Payer: Self-pay | Admitting: Internal Medicine

## 2015-09-10 ENCOUNTER — Telehealth: Payer: Self-pay | Admitting: Family Medicine

## 2015-09-10 ENCOUNTER — Ambulatory Visit (INDEPENDENT_AMBULATORY_CARE_PROVIDER_SITE_OTHER): Payer: Medicare Other | Admitting: Internal Medicine

## 2015-09-10 VITALS — BP 130/72 | HR 74 | Ht 63.0 in | Wt 242.8 lb

## 2015-09-10 DIAGNOSIS — J4530 Mild persistent asthma, uncomplicated: Secondary | ICD-10-CM | POA: Diagnosis not present

## 2015-09-10 DIAGNOSIS — E669 Obesity, unspecified: Secondary | ICD-10-CM

## 2015-09-10 DIAGNOSIS — R0600 Dyspnea, unspecified: Secondary | ICD-10-CM | POA: Diagnosis not present

## 2015-09-10 MED ORDER — ALBUTEROL SULFATE HFA 108 (90 BASE) MCG/ACT IN AERS: 2.0000 | INHALATION_SPRAY | Freq: Four times a day (QID) | RESPIRATORY_TRACT | Status: AC | PRN

## 2015-09-10 NOTE — Addendum Note (Signed)
Addended by: Maxwell Marion A on: 09/10/2015 11:35 AM   Modules accepted: Orders

## 2015-09-10 NOTE — Patient Instructions (Addendum)
--  o0Alpha-1 testing today.   --Albuterol MDI, 2 puffs before exercise. No refills. Call if this helps your breathing and we will call in a refill.   --Weight loss would benefit your health and breathing.

## 2015-09-10 NOTE — Progress Notes (Addendum)
[facility] [place] [facility]     Assessment and Plan:  Dyspnea. -I suspect that this is multifactorial from deconditioning, obesity. We discussed the possibility that given that her dyspnea occurs only on exercise that she may have an element of exercise-induced asthma. She does have a dog at home, and has never been tested for allergies. -She is very concerned about her mother's diagnosis of emphysema and whether she might have emphysema. Also, she was assured today that on the review the pulmonary infection testing. We will also check a alpha-1 test to see if there is any genetic component that could contribute to emphysema in her.  Asthma-exercise-induced. -Possible diagnosis, given her dyspnea on exertion. -We're going to empirically try albuterol, she is instructed to take 2 puffs before walking her dog and to see if this improves her breathing. If it does, she is asked to call us back and we will refill the medication.  Obesity. -We discussed the importance of weight loss on her overall health, she is committed to attempting this, and she is considering joining [employer], and I highly encouraged her to do this..  Excessive daytime somnolence, snoring. -We discussed possibility of sleep apnea testing, she would like to commit herself to weight loss first and then readdress this at another time. --Currently takes trazodone nightly for insomnia.   Chronic diastolic congestive heart failure. -Stable   Date: 09/10/2015  MRN# [id number] Carolyn Campos March 26, 1951   Carolyn Campos is a 65 y.o. old female seen in follow up for chief complaint of  Chief Complaint  Patient presents with  . PULMONARY CONSULT    former VM pt. seen for DOE. pt. states she has SOB. occ. chest pain/tightness. had CXR, pft & smw 1/18     HPI:   The patient is a 65 year old female who sees my partner, Dr. Dema Severin, with dyspnea on exertion. It was thought that her dyspnea was secondary to  deconditioning, mild CHF, obesity. She was asked to increase her physical activity, and lose weight. She underwent pulmonary function testing in the past which showed mild restrictive disease, likely due to body habitus.  She walks a dog and does ok with that. She gets winded with walking an incline or steps. She is concerned about emphysema because her mother had that. She herself is a never-smoker.   Chest x-ray reviewed, images and report 09/03/2015: Unremarkable. Chest x-ray. PFT reviewed tracings 09/03/2015: Well preserved lung functions, no evidence of COPD or emphysema. RV was 60% and TLC 74%, suggestive of restrictive lung disease from body habitus. Review of 6 minute walk. Raw data 09/03/2015: Beginning O2 sat was 97%, patient walked 423 m, sat at the end of the test was 92% with minimal dyspnea.  SIX MIN WALK 09/03/2015 03/28/2014  Medications none Synthroid , taken at 0600  Supplimental Oxygen during Test? (L/min) No No  Laps 8 8  Partial Lap (in Meters) 39 7  Baseline BP (sitting) 124/70 124/66  Baseline Heartrate 67 69  Baseline Dyspnea (Borg Scale) 2 0  Baseline Fatigue (Borg Scale) 0 1  Baseline SPO2 95 97  BP (sitting) 158/68 144/82  Heartrate 104 114  Dyspnea (Borg Scale) 3 3  Fatigue (Borg Scale) 0 4  SPO2 93 100  BP (sitting) 142/62 134/72  Heartrate 82 93  SPO2 97 97  Stopped or Paused before Six Minutes No No  Distance Completed 423 391  Tech Comments: pt walked at a moderate pace. Pt complained of L arm pain  during walk.  States this is normal for her, as she has had 4 surgeries on this arm after a work injury.  Pt tolerated walk well.     Medication:   Outpatient Encounter Prescriptions as of 09/10/2015  Medication Sig  . ALPRAZolam (XANAX) 0.5 MG tablet TAKE 1 TABLET AT BEDTIME AS NEEDED  . aspirin 81 MG tablet Take 81 mg by mouth daily.  Marland Kitchen atorvastatin (LIPITOR) 10 MG tablet TAKE ONE (1) TABLET BY MOUTH ONCE DAILY  "OV NEEDED FOR REFILLS"  . buPROPion  (WELLBUTRIN XL) 300 MG 24 hr tablet Take 1 tablet (300 mg total) by mouth daily.  . Fiber TABS 500 mg. Take one daily.  . fish oil-omega-3 fatty acids 1000 MG capsule Take 2 g by mouth daily.  Marland Kitchen levothyroxine (SYNTHROID, LEVOTHROID) 112 MCG tablet Take 1 tablet (112 mcg total) by mouth daily.  Marland Kitchen losartan (COZAAR) 25 MG tablet Take 1 tablet (25 mg total) by mouth daily.  . sertraline (ZOLOFT) 100 MG tablet 1 tablet daily  . traZODone (DESYREL) 50 MG tablet Take 1 tablet (50 mg total) by mouth at bedtime as needed.  . triamcinolone cream (KENALOG) 0.1 % Apply topically 2 (two) times daily.   No facility-administered encounter medications on file as of 09/10/2015.     Allergies:  Cephalexin  Review of Systems: Gen:  Denies  fever, sweats. HEENT: Denies blurred vision. Cvc:  No dizziness, chest pain or heaviness Resp:   Denies cough or sputum porduction. Gi: Denies swallowing difficulty, stomach pain.  Gu:  Denies bladder incontinence, burning urine Ext:   No Joint pain, stiffness. Skin: No skin rash, easy bruising. Endoc:  No polyuria, polydipsia. Psych: No depression, insomnia. Other:  All other systems were reviewed and found to be negative other than what is mentioned in the HPI.   Physical Examination:   VS: BP 130/72 mmHg  Pulse 74  Ht  (1.6 m)  Wt 242 lb 12.8 oz (110.133 kg)  BMI 43.02 kg/m2  SpO2 92%  General Appearance: No distress  Neuro:without focal findings,  speech normal,  HEENT: PERRLA, EOM intact. Pulmonary: normal breath sounds, No wheezing.   CardiovascularNormal S1,S2.  No m/r/g.   Abdomen: Benign, Soft, non-tender. Renal:  No costovertebral tenderness  GU:  Not performed at this time. Endoc: No evident thyromegaly, no signs of acromegaly. Skin:   warm, no rash. Extremities: normal, no cyanosis, clubbing.   LABORATORY PANEL:   CBC No results for input(s): WBC, HGB, HCT, PLT in the last 168  hours. ------------------------------------------------------------------------------------------------------------------  Chemistries  No results for input(s): NA, K, CL, CO2, GLUCOSE, BUN, CREATININE, CALCIUM, MG, AST, ALT, ALKPHOS, BILITOT in the last 168 hours.  Invalid input(s): GFRCGP ------------------------------------------------------------------------------------------------------------------  Cardiac Enzymes No results for input(s): TROPONINI in the last 168 hours. ------------------------------------------------------------  RADIOLOGY:   No results found for this or any previous visit. Results for orders placed during the hospital encounter of 09/03/15  DG Chest 2 View   Narrative CLINICAL DATA:  65 year old female with chronic shortness of breath. Nonsmoker. Subsequent encounter.  EXAM: CHEST  2 VIEW  COMPARISON:  Chest radiographs 06/16/2015 and earlier.  FINDINGS: Chronic large body habitus and somewhat low lung volumes. Cardiac size is stable at the upper limits of normal. Other mediastinal contours are within normal limits. Visualized tracheal air column is within normal limits. No pneumothorax, pulmonary edema, pleural effusion or confluent pulmonary opacity. No acute osseous abnormality identified.  Calcified aortic atherosclerosis.  IMPRESSION: Stable somewhat low lung volumes  with calcified aortic atherosclerosis, but otherwise negative.   Electronically Signed   By: Odessa Fleming M.D.   On: 09/03/2015 13:52    ------------------------------------------------------------------------------------------------------------------  Thank  you for allowing [facility] to assist in the care of your patient. Our recommendations are noted above.  Please contact us if we can be of further service.   Wells Guiles, MD.  [facility] Office Number: [phone]  Santiago Glad, M.D.  Stephanie Acre, M.D.  Billy Fischer, M.D

## 2015-09-10 NOTE — Telephone Encounter (Signed)
SPOKE WITH PATIENT AND SHE IS GOING TO CALL AND SCHEDULE HER MAMMOGRAM TODAY IN Carolyn Campos.  SHE ALSO WENT AHEAD AND SCHEDULED HER ANNUAL EXAM FOR March 30 2016 WITH DR [name].

## 2015-09-15 ENCOUNTER — Ambulatory Visit (INDEPENDENT_AMBULATORY_CARE_PROVIDER_SITE_OTHER): Payer: Medicare Other | Admitting: Family Medicine

## 2015-09-15 ENCOUNTER — Encounter: Payer: Self-pay | Admitting: Family Medicine

## 2015-09-15 VITALS — BP 155/77 | HR 67 | Temp 97.7°F | Resp 16 | Ht 63.0 in | Wt 242.4 lb

## 2015-09-15 DIAGNOSIS — E785 Hyperlipidemia, unspecified: Secondary | ICD-10-CM | POA: Diagnosis not present

## 2015-09-15 DIAGNOSIS — G4700 Insomnia, unspecified: Secondary | ICD-10-CM | POA: Diagnosis not present

## 2015-09-15 DIAGNOSIS — F418 Other specified anxiety disorders: Secondary | ICD-10-CM

## 2015-09-15 DIAGNOSIS — F329 Major depressive disorder, single episode, unspecified: Secondary | ICD-10-CM

## 2015-09-15 DIAGNOSIS — F419 Anxiety disorder, unspecified: Secondary | ICD-10-CM

## 2015-09-15 DIAGNOSIS — F32A Depression, unspecified: Secondary | ICD-10-CM

## 2015-09-15 DIAGNOSIS — R0600 Dyspnea, unspecified: Secondary | ICD-10-CM

## 2015-09-15 DIAGNOSIS — I10 Essential (primary) hypertension: Secondary | ICD-10-CM

## 2015-09-15 DIAGNOSIS — R7302 Impaired glucose tolerance (oral): Secondary | ICD-10-CM | POA: Diagnosis not present

## 2015-09-15 MED ORDER — LOSARTAN POTASSIUM 25 MG PO TABS: 25.0000 mg | ORAL_TABLET | Freq: Every day | ORAL | Status: DC

## 2015-09-15 MED ORDER — BUPROPION HCL ER (XL) 300 MG PO TB24: 300.0000 mg | ORAL_TABLET | Freq: Every day | ORAL | Status: DC

## 2015-09-15 MED ORDER — SERTRALINE HCL 100 MG PO TABS: ORAL_TABLET | ORAL | Status: DC

## 2015-09-15 MED ORDER — ATORVASTATIN CALCIUM 10 MG PO TABS: ORAL_TABLET | ORAL | Status: DC

## 2015-09-15 MED ORDER — LEVOTHYROXINE SODIUM 112 MCG PO TABS: 112.0000 ug | ORAL_TABLET | Freq: Every day | ORAL | Status: DC

## 2015-09-15 MED ORDER — TRAZODONE HCL 50 MG PO TABS: 50.0000 mg | ORAL_TABLET | Freq: Every evening | ORAL | Status: DC | PRN

## 2015-09-15 NOTE — Progress Notes (Signed)
Subjective:    Patient ID: Carolyn Campos, female    DOB: 12/18/1950, 65 y.o.   MRN: [id number]  09/15/2015  Medication Refill and Depression   HPI This 65 y.o. female presents for three month follow-up:  1. DOE:  S/p pulmonology follow-up.  S/p extensive testing that was normal.  Prescribed Albuterol to use prior to exercise; recommended weight loss.  Recommends [employer].    2.  Anxiety and depression: father died this morning; husband to start HD in upcoming three months.  Must help husband out of chair recliner.  Father with liftchair and going to use it.  No SI/HI.  Down on self.  A little better emotionally from last visit.  Not so down and sad as last visit in 06/16/15.  Zane living with patient; Joni Reining is out; she moved out. Taking grandson to and from school; 83 years old.  Zane wanted to live with patient.  Mother in a new relationship.  Zane sees father twice month.     Review of Systems  Constitutional: Negative for fever, chills, diaphoresis and fatigue.  Eyes: Negative for visual disturbance.  Respiratory: Negative for cough and shortness of breath.   Cardiovascular: Negative for chest pain, palpitations and leg swelling.  Gastrointestinal: Negative for nausea, vomiting, abdominal pain, diarrhea and constipation.  Endocrine: Negative for cold intolerance, heat intolerance, polydipsia, polyphagia and polyuria.  Neurological: Negative for dizziness, tremors, seizures, syncope, facial asymmetry, speech difficulty, weakness, light-headedness, numbness and headaches.    Past Medical History  Diagnosis Date  . Pure hypercholesterolemia   . Chest pain, unspecified   . Postmenopausal bleeding   . Abdominal pain, unspecified site   . Chest pain, unspecified   . Anxiety state, unspecified   . Depressive disorder, not elsewhere classified   . Unspecified hypothyroidism   . Insomnia, unspecified   . Heart murmur     hx  . Hypertension    Past Surgical History  Procedure  Laterality Date  . Cesarean section      x 2  . Dilation and curettage of uterus    . Arm surgery      x 4 post accident at work, with skin graft, [facility]  . Abdominal hysterectomy  04/16/2012    one ovary resected.  No malignancy.  Post-menopausal bleeding.  [facility].  . Colonoscopy  08/16/2009    normal.  [facility].  Repeat in 10 years.   Allergies  Allergen Reactions  . Cephalexin Rash   Current Outpatient Prescriptions  Medication Sig Dispense Refill  . albuterol (PROVENTIL HFA;VENTOLIN HFA) 108 (90 Base) MCG/ACT inhaler Inhale 2 puffs into the lungs every 6 (six) hours as needed for wheezing or shortness of breath. 1 Inhaler 4  . ALPRAZolam (XANAX) 0.5 MG tablet TAKE 1 TABLET AT BEDTIME AS NEEDED 30 tablet 5  . aspirin 81 MG tablet Take 81 mg by mouth daily.    Marland Kitchen atorvastatin (LIPITOR) 10 MG tablet TAKE ONE (1) TABLET BY MOUTH ONCE DAILY 30 tablet 5  . buPROPion (WELLBUTRIN XL) 300 MG 24 hr tablet Take 1 tablet (300 mg total) by mouth daily. 30 tablet 5  . Fiber TABS 500 mg. Take one daily.    . fish oil-omega-3 fatty acids 1000 MG capsule Take 2 g by mouth daily.    Marland Kitchen levothyroxine (SYNTHROID, LEVOTHROID) 112 MCG tablet Take 1 tablet (112 mcg total) by mouth daily. 30 tablet 5  . losartan (COZAAR) 25 MG tablet Take 1 tablet (25 mg total) by mouth  daily. 30 tablet 5  . traZODone (DESYREL) 50 MG tablet Take 1 tablet (50 mg total) by mouth at bedtime as needed. 30 tablet 5  . triamcinolone cream (KENALOG) 0.1 % Apply topically 2 (two) times daily. 30 g 0  . sertraline (ZOLOFT) 100 MG tablet 1 tablet daily 30 tablet 5   No current facility-administered medications for this visit.   Social History   Social History  . Marital Status: Married    Spouse Name: N/A  . Number of Children: 2  . Years of Education: 12   Occupational History  . disabled     due to arm surgery   Social History Main Topics  . Smoking status: Never Smoker   . Smokeless tobacco: Never Used  .  Alcohol Use: No  . Drug Use: No  . Sexual Activity: Not on file   Other Topics Concern  . Not on file   Social History Narrative   Sexual activity:  Not sexually active.       Seatbelt:  Always uses seat belts.       Smoke alarm and carbon monoxide detector in the home.       Guns: Has no unsecured guns in the home.       Caffeine:  Does not consume any caffeine.       Exercise: walks daily for 30 minutes walks 1-2 miles about every other day. Has dog.       Marital status:  Married x 41 years; moderately happy; married; no abuse;       Children:  2 daughters, 3 grandchildren/sons.       Organ donor: Yes.       Living Will: Patient does not have living will, DOES have HCPOA.      Employment: disability 01-06-2009 for [employer] comp injury arm.   Family History  Problem Relation Age of Onset  . Heart disease Brother     from drug abuse  . Alcohol abuse Brother   . Cirrhosis Brother   . COPD Mother   . Heart disease Mother     CHF  . Heart failure Mother   . Cancer Father     lung, prostate  . Hyperlipidemia Father   . Heart disease Father 6    CABG  . Ulcers Brother   . Pulmonary fibrosis Mother     died in 2008-01-07       Objective:    BP 155/77 mmHg  Pulse 67  Temp(Src) 97.7 F (36.5 C) (Oral)  Resp 16  Ht  (1.6 m)  Wt 242 lb 6.4 oz (109.952 kg)  BMI 42.95 kg/m2  SpO2 97% Physical Exam  Constitutional: She is oriented to person, place, and time. She appears well-developed and well-nourished. No distress.  HENT:  Head: Normocephalic and atraumatic.  Right Ear: External ear normal.  Left Ear: External ear normal.  Nose: Nose normal.  Mouth/Throat: Oropharynx is clear and moist.  Eyes: Conjunctivae and EOM are normal. Pupils are equal, round, and reactive to light.  Neck: Normal range of motion. Neck supple. Carotid bruit is not present. No thyromegaly present.  Cardiovascular: Normal rate, regular rhythm, normal heart sounds and intact distal pulses.  Exam  reveals no gallop and no friction rub.   No murmur heard. Pulmonary/Chest: Effort normal and breath sounds normal. She has no wheezes. She has no rales.  Abdominal: Soft. Bowel sounds are normal. She exhibits no distension and no mass. There is no tenderness. There is  no rebound and no guarding.  Lymphadenopathy:    She has no cervical adenopathy.  Neurological: She is alert and oriented to person, place, and time. No cranial nerve deficit.  Skin: Skin is warm and dry. No rash noted. She is not diaphoretic. No erythema. No pallor.  Psychiatric: She has a normal mood and affect. Her behavior is normal.   Results for orders placed or performed in visit on 09/03/15  Pulmonary function test  Result Value Ref Range   FVC-Pre 2.38 L   FVC-%Pred-Pre 77 %   FVC-Post 2.44 L   FVC-%Pred-Post 79 %   FVC-%Change-Post 2 %   FEV1-Pre 2.10 L   FEV1-%Pred-Pre 89 %   FEV1-Post 2.17 L   FEV1-%Pred-Post 92 %   FEV1-%Change-Post 3 %   FEV6-Pre 2.38 L   FEV6-%Pred-Pre 80 %   FEV6-Post 2.44 L   FEV6-%Pred-Post 82 %   FEV6-%Change-Post 2 %   Pre FEV1/FVC ratio 88 %   FEV1FVC-%Pred-Pre 114 %   Post FEV1/FVC ratio 89 %   FEV1FVC-%Change-Post 0 %   Pre FEV6/FVC Ratio 100 %   FEV6FVC-%Pred-Pre 104 %   Post FEV6/FVC ratio 100 %   FEV6FVC-%Pred-Post 104 %   FEF 25-75 Pre 2.89 L/sec   FEF2575-%Pred-Pre 136 %   FEF 25-75 Post 3.48 L/sec   FEF2575-%Pred-Post 163 %   FEF2575-%Change-Post 20 %   DLCO unc 17.10 ml/min/mmHg   DLCO unc % pred 74 %   DL/VA 1.61 ml/min/mmHg/L   DL/VA % pred 98 %       Assessment & Plan:   1. Hyperlipidemia   2. Essential hypertension, benign   3. Anxiety and depression   4. Glucose intolerance (impaired glucose tolerance)   5. Dyspnea   6. Insomnia     Orders Placed This Encounter  Procedures  . CBC with Differential/Platelet  . Comprehensive metabolic panel    Order Specific Question:  Has the patient fasted?    Answer:  Yes  . Lipid panel    Order  Specific Question:  Has the patient fasted?    Answer:  Yes   Meds ordered this encounter  Medications  . traZODone (DESYREL) 50 MG tablet    Sig: Take 1 tablet (50 mg total) by mouth at bedtime as needed.    Dispense:  30 tablet    Refill:  5  . sertraline (ZOLOFT) 100 MG tablet    Sig: 1 tablet daily    Dispense:  30 tablet    Refill:  5  . losartan (COZAAR) 25 MG tablet    Sig: Take 1 tablet (25 mg total) by mouth daily.    Dispense:  30 tablet    Refill:  5  . levothyroxine (SYNTHROID, LEVOTHROID) 112 MCG tablet    Sig: Take 1 tablet (112 mcg total) by mouth daily.    Dispense:  30 tablet    Refill:  5  . buPROPion (WELLBUTRIN XL) 300 MG 24 hr tablet    Sig: Take 1 tablet (300 mg total) by mouth daily.    Dispense:  30 tablet    Refill:  5  . atorvastatin (LIPITOR) 10 MG tablet    Sig: TAKE ONE (1) TABLET BY MOUTH ONCE DAILY    Dispense:  30 tablet    Refill:  5    Return in about 6 months (around 03/14/2016) for complete physical examiniation.    Harmony Sandell Paulita Fujita, M.D. [facility] [address] [place],  [place]  [address] [phone] phone [phone] fax

## 2015-09-17 LAB — CBC WITH DIFFERENTIAL/PLATELET
Basophils Absolute: 0 10*3/uL (ref 0.0–0.1)
Basophils Relative: 0 % (ref 0–1)
EOS ABS: 0.1 10*3/uL (ref 0.0–0.7)
EOS PCT: 2 % (ref 0–5)
HEMATOCRIT: 37.9 % (ref 36.0–46.0)
Hemoglobin: 12.4 g/dL (ref 12.0–15.0)
LYMPHS ABS: 2.6 10*3/uL (ref 0.7–4.0)
LYMPHS PCT: 44 % (ref 12–46)
MCH: 31 pg (ref 26.0–34.0)
MCHC: 32.7 g/dL (ref 30.0–36.0)
MCV: 94.8 fL (ref 78.0–100.0)
MONO ABS: 0.5 10*3/uL (ref 0.1–1.0)
MONOS PCT: 9 % (ref 3–12)
MPV: 10 fL (ref 8.6–12.4)
Neutro Abs: 2.7 10*3/uL (ref 1.7–7.7)
Neutrophils Relative %: 45 % (ref 43–77)
Platelets: 265 10*3/uL (ref 150–400)
RBC: 4 MIL/uL (ref 3.87–5.11)
RDW: 13.3 % (ref 11.5–15.5)
WBC: 5.9 10*3/uL (ref 4.0–10.5)

## 2015-09-17 LAB — COMPREHENSIVE METABOLIC PANEL
ALK PHOS: 75 U/L (ref 33–130)
ALT: 16 U/L (ref 6–29)
AST: 15 U/L (ref 10–35)
Albumin: 4.5 g/dL (ref 3.6–5.1)
BUN: 13 mg/dL (ref 7–25)
CALCIUM: 9.8 mg/dL (ref 8.6–10.4)
CHLORIDE: 102 mmol/L (ref 98–110)
CO2: 27 mmol/L (ref 20–31)
Creat: 0.9 mg/dL (ref 0.50–0.99)
GLUCOSE: 95 mg/dL (ref 65–99)
POTASSIUM: 4.2 mmol/L (ref 3.5–5.3)
Sodium: 141 mmol/L (ref 135–146)
Total Bilirubin: 0.5 mg/dL (ref 0.2–1.2)
Total Protein: 6.8 g/dL (ref 6.1–8.1)

## 2015-09-17 LAB — LIPID PANEL
CHOL/HDL RATIO: 2.9 ratio (ref <=5.0)
Cholesterol: 149 mg/dL (ref 125–200)
HDL: 52 mg/dL (ref >=46)
LDL Cholesterol: 81 mg/dL (ref <130)
Triglycerides: 80 mg/dL (ref <150)
VLDL: 16 mg/dL (ref <30)

## 2015-09-18 ENCOUNTER — Other Ambulatory Visit: Payer: Self-pay | Admitting: Family Medicine

## 2015-09-18 ENCOUNTER — Ambulatory Visit
Admission: RE | Admit: 2015-09-18 | Discharge: 2015-09-18 | Disposition: A | Discharge status: Home / Self Care | Payer: Medicare Other | Source: Ambulatory Visit | Attending: Family Medicine | Admitting: Family Medicine

## 2015-09-18 DIAGNOSIS — Z1231 Encounter for screening mammogram for malignant neoplasm of breast: Secondary | ICD-10-CM | POA: Diagnosis not present

## 2015-09-18 LAB — HM MAMMOGRAPHY

## 2015-09-22 ENCOUNTER — Encounter: Payer: Self-pay | Admitting: Family Medicine

## 2015-10-03 ENCOUNTER — Telehealth: Payer: Self-pay | Admitting: Internal Medicine

## 2015-10-03 NOTE — Telephone Encounter (Signed)
[phone] calling back

## 2015-10-03 NOTE — Telephone Encounter (Signed)
Per 09/10/15 OV: Patient Instructions     --o0Alpha-1 testing today.  --Albuterol MDI, 2 puffs before exercise. No refills. Call if this helps your breathing and we will call in a refill.  --Weight loss would benefit your health and breathing  ---  ATC pt, someone answered and then hung up. Tried calling back and same thing happened. WCB

## 2015-10-03 NOTE — Telephone Encounter (Signed)
Pt informed results were normal. Nothing further needed.

## 2015-10-03 NOTE — Telephone Encounter (Signed)
Called spoke with pt. She reports she had a "finger stick" for alpha one test that was going to be sent out. She is wanting to know if results are available. Please advise Misty thanks

## 2015-10-17 DIAGNOSIS — R1310 Dysphagia, unspecified: Secondary | ICD-10-CM | POA: Diagnosis not present

## 2015-10-17 DIAGNOSIS — R490 Dysphonia: Secondary | ICD-10-CM | POA: Diagnosis not present

## 2015-10-28 ENCOUNTER — Other Ambulatory Visit: Payer: Self-pay | Admitting: Gastroenterology

## 2015-10-28 DIAGNOSIS — R1310 Dysphagia, unspecified: Secondary | ICD-10-CM

## 2015-10-31 ENCOUNTER — Ambulatory Visit
Admission: RE | Admit: 2015-10-31 | Discharge: 2015-10-31 | Disposition: A | Discharge status: Home / Self Care | Payer: Medicare Other | Source: Ambulatory Visit | Attending: Gastroenterology | Admitting: Gastroenterology

## 2015-10-31 DIAGNOSIS — R1310 Dysphagia, unspecified: Secondary | ICD-10-CM | POA: Insufficient documentation

## 2015-11-17 ENCOUNTER — Other Ambulatory Visit: Payer: Self-pay | Admitting: Family Medicine

## 2015-11-19 NOTE — Telephone Encounter (Signed)
Please call in/fax refills of Alprazolam as approved.

## 2015-11-19 NOTE — Telephone Encounter (Signed)
Called in.

## 2015-11-27 ENCOUNTER — Encounter: Payer: Self-pay | Admitting: Cardiovascular Disease

## 2015-11-27 ENCOUNTER — Encounter (INDEPENDENT_AMBULATORY_CARE_PROVIDER_SITE_OTHER): Payer: Self-pay

## 2015-11-27 ENCOUNTER — Ambulatory Visit (INDEPENDENT_AMBULATORY_CARE_PROVIDER_SITE_OTHER): Payer: Medicare Other | Admitting: Cardiovascular Disease

## 2015-11-27 VITALS — BP 120/60 | HR 65 | Ht 63.0 in | Wt 237.1 lb

## 2015-11-27 DIAGNOSIS — I10 Essential (primary) hypertension: Secondary | ICD-10-CM

## 2015-11-27 NOTE — Progress Notes (Signed)
Cardiology Office Note   Date:  11/27/2015   ID:  Carolyn Campos, DOB [birth year], MRN [id number]  PCP:  Nilda Simmer, MD  Cardiologist:   Lorine Bears, MD   Chief Complaint  Patient presents with  . Hypertension    no sx      History of Present Illness: Carolyn Campos is a 65 y.o. female who presents for a followup visit regarding exertional dyspnea and small ASD . She has no significant risk factors for coronary artery disease except for obesity. There is family history of coronary artery disease but not prematurely. Her father had CABG at the age of 28. She is a lifelong nonsmoker. Treadmill stress test in April of 2014 showed no evidence of ischemia. There was hypertensive response to exercise with associated dyspnea but no chest discomfort.  Echocardiogram which showed normal LV systolic function with moderate left ventricular hypertrophy, grade 1 diastolic dysfunction and possible small ASD with a tiny left-to-right shunt with normal right-sided chambers and pressures.  However, on repeat echocardiogram in March 2016, there was no evidence of ASD.  She has been doing well overall with no chest pain and stable exertional dyspnea. No orthopnea, PND or leg edema.  Past Medical History  Diagnosis Date  . Pure hypercholesterolemia   . Chest pain, unspecified   . Postmenopausal bleeding   . Abdominal pain, unspecified site   . Chest pain, unspecified   . Anxiety state, unspecified   . Depressive disorder, not elsewhere classified   . Unspecified hypothyroidism   . Insomnia, unspecified   . Heart murmur     hx  . Hypertension     Past Surgical History  Procedure Laterality Date  . Cesarean section      x 2  . Dilation and curettage of uterus    . Arm surgery      x 4 post accident at work, with skin graft, [facility]  . Abdominal hysterectomy  04/16/2012    one ovary resected.  No malignancy.  Post-menopausal bleeding.  [facility].  . Colonoscopy  08/16/2009      normal.  [facility].  Repeat in 10 years.     Current Outpatient Prescriptions  Medication Sig Dispense Refill  . albuterol (PROVENTIL HFA;VENTOLIN HFA) 108 (90 Base) MCG/ACT inhaler Inhale 2 puffs into the lungs every 6 (six) hours as needed for wheezing or shortness of breath. 1 Inhaler 4  . ALPRAZolam (XANAX) 0.5 MG tablet TAKE (1) TABLET BY MOUTH EVERY NIGHT AT BEDTIME AS NEEDED 30 tablet 5  . aspirin 81 MG tablet Take 81 mg by mouth daily.    Marland Kitchen atorvastatin (LIPITOR) 10 MG tablet TAKE ONE (1) TABLET BY MOUTH ONCE DAILY 30 tablet 5  . buPROPion (WELLBUTRIN XL) 300 MG 24 hr tablet Take 1 tablet (300 mg total) by mouth daily. 30 tablet 5  . Fiber TABS 500 mg. Take one daily.    . fish oil-omega-3 fatty acids 1000 MG capsule Take 2 g by mouth daily.    Marland Kitchen levothyroxine (SYNTHROID, LEVOTHROID) 112 MCG tablet Take 1 tablet (112 mcg total) by mouth daily. 30 tablet 5  . losartan (COZAAR) 25 MG tablet Take 1 tablet (25 mg total) by mouth daily. 30 tablet 5  . sertraline (ZOLOFT) 100 MG tablet 1 tablet daily 30 tablet 5  . traZODone (DESYREL) 50 MG tablet Take 1 tablet (50 mg total) by mouth at bedtime as needed. 30 tablet 5  . triamcinolone cream (KENALOG) 0.1 % Apply topically  2 (two) times daily. 30 g 0   No current facility-administered medications for this visit.    Allergies:   Cephalexin    Social History:  The patient  reports that she has never smoked. She has never used smokeless tobacco. She reports that she does not drink alcohol or use illicit drugs.   Family History:  The patient's family history includes Alcohol abuse in her brother; Breast cancer (age of onset: 3574) in her paternal aunt; COPD in her mother; Cancer in her father; Cirrhosis in her brother; Heart disease in her brother and mother; Heart disease (age of onset: 4265) in her father; Heart failure in her mother; Hyperlipidemia in her father; Pulmonary fibrosis in her mother; Ulcers in her brother.    ROS:  Please see  the history of present illness.   Otherwise, review of systems are positive for none.   All other systems are reviewed and negative.    PHYSICAL EXAM: VS:  BP 120/60 mmHg  Pulse 65  Ht 5\' 3"  (1.6 m)  Wt 237 lb 1.9 oz (107.557 kg)  BMI 42.01 kg/m2  SpO2 93% , BMI Body mass index is 42.01 kg/(m^2). GEN: Well nourished, well developed, in no acute distress HEENT: normal Neck: no JVD, carotid bruits, or masses Cardiac: RRR; no murmurs, rubs, or gallops,no edema  Respiratory:  clear to auscultation bilaterally, normal work of breathing GI: soft, nontender, nondistended, + BS MS: no deformity or atrophy Skin: warm and dry, no rash Neuro:  Strength and sensation are intact Psych: euthymic mood, full affect   EKG:  EKG is ordered today. The ekg ordered today demonstrates normal sinus rhythm with low voltage.   Recent Labs: 06/16/2015: TSH 1.730 09/15/2015: ALT 16; BUN 13; Creat 0.90; Hemoglobin 12.4; Platelets 265; Potassium 4.2; Sodium 141    Lipid Panel    Component Value Date/Time   CHOL 149 09/15/2015 1201   TRIG 80 09/15/2015 1201   HDL 52 09/15/2015 1201   CHOLHDL 2.9 09/15/2015 1201   VLDL 16 09/15/2015 1201   LDLCALC 81 09/15/2015 1201      Wt Readings from Last 3 Encounters:  11/27/15 237 lb 1.9 oz (107.557 kg)  09/15/15 242 lb 6.4 oz (109.952 kg)  09/10/15 242 lb 12.8 oz (110.133 kg)       ASSESSMENT AND PLAN:  1.  Exertional dyspnea: Seems to be multifactorial with an element of physical deconditioning. Most recent echocardiogram last year showed normal LV systolic function, no significant pulmonary hypertension and no evidence of ASD which was seen on previous echocardiograms. The patient can follow-up with me as needed.  2. Essential hypertension: Blood pressure is controlled on losartan.   Signed,  Lorine BearsMuhammad Eloise Mula, MD  11/27/2015 10:33 AM    [facility]

## 2015-11-27 NOTE — Patient Instructions (Signed)
Medication Instructions: Continue same medications.   Labwork: None.   Procedures/Testing: None.   Follow-Up: As needed.   Any Additional Special Instructions Will Be Listed Below (If Applicable).     If you need a refill on your cardiac medications before your next appointment, please call your pharmacy.

## 2015-12-01 ENCOUNTER — Ambulatory Visit: Payer: Medicare Other | Admitting: Anesthesiology

## 2015-12-01 ENCOUNTER — Encounter
Admission: RE | Disposition: A | Discharge status: Home / Self Care | Payer: Self-pay | Source: Ambulatory Visit | Attending: Gastroenterology

## 2015-12-01 ENCOUNTER — Ambulatory Visit
Admission: RE | Admit: 2015-12-01 | Discharge: 2015-12-01 | Disposition: A | Discharge status: Home / Self Care | Payer: Medicare Other | Source: Ambulatory Visit | Attending: Gastroenterology | Admitting: Gastroenterology

## 2015-12-01 ENCOUNTER — Encounter: Payer: Self-pay | Admitting: *Deleted

## 2015-12-01 DIAGNOSIS — E039 Hypothyroidism, unspecified: Secondary | ICD-10-CM | POA: Insufficient documentation

## 2015-12-01 DIAGNOSIS — K319 Disease of stomach and duodenum, unspecified: Secondary | ICD-10-CM | POA: Diagnosis not present

## 2015-12-01 DIAGNOSIS — F329 Major depressive disorder, single episode, unspecified: Secondary | ICD-10-CM | POA: Insufficient documentation

## 2015-12-01 DIAGNOSIS — F419 Anxiety disorder, unspecified: Secondary | ICD-10-CM | POA: Insufficient documentation

## 2015-12-01 DIAGNOSIS — Z881 Allergy status to other antibiotic agents status: Secondary | ICD-10-CM | POA: Insufficient documentation

## 2015-12-01 DIAGNOSIS — Z79899 Other long term (current) drug therapy: Secondary | ICD-10-CM | POA: Insufficient documentation

## 2015-12-01 DIAGNOSIS — I10 Essential (primary) hypertension: Secondary | ICD-10-CM | POA: Insufficient documentation

## 2015-12-01 DIAGNOSIS — Z7982 Long term (current) use of aspirin: Secondary | ICD-10-CM | POA: Diagnosis not present

## 2015-12-01 DIAGNOSIS — R1310 Dysphagia, unspecified: Secondary | ICD-10-CM | POA: Diagnosis not present

## 2015-12-01 DIAGNOSIS — K209 Esophagitis, unspecified: Secondary | ICD-10-CM | POA: Diagnosis not present

## 2015-12-01 DIAGNOSIS — G4700 Insomnia, unspecified: Secondary | ICD-10-CM | POA: Diagnosis not present

## 2015-12-01 DIAGNOSIS — N950 Postmenopausal bleeding: Secondary | ICD-10-CM | POA: Diagnosis not present

## 2015-12-01 DIAGNOSIS — K210 Gastro-esophageal reflux disease with esophagitis: Secondary | ICD-10-CM | POA: Diagnosis not present

## 2015-12-01 DIAGNOSIS — K2960 Other gastritis without bleeding: Secondary | ICD-10-CM | POA: Diagnosis not present

## 2015-12-01 DIAGNOSIS — K2980 Duodenitis without bleeding: Secondary | ICD-10-CM | POA: Diagnosis not present

## 2015-12-01 DIAGNOSIS — E7800 Pure hypercholesterolemia, unspecified: Secondary | ICD-10-CM | POA: Diagnosis not present

## 2015-12-01 DIAGNOSIS — K259 Gastric ulcer, unspecified as acute or chronic, without hemorrhage or perforation: Secondary | ICD-10-CM | POA: Insufficient documentation

## 2015-12-01 DIAGNOSIS — K3189 Other diseases of stomach and duodenum: Secondary | ICD-10-CM | POA: Diagnosis not present

## 2015-12-01 DIAGNOSIS — K2970 Gastritis, unspecified, without bleeding: Secondary | ICD-10-CM | POA: Diagnosis not present

## 2015-12-01 DIAGNOSIS — K2990 Gastroduodenitis, unspecified, without bleeding: Secondary | ICD-10-CM | POA: Diagnosis not present

## 2015-12-01 HISTORY — PX: ESOPHAGOGASTRODUODENOSCOPY (EGD) WITH PROPOFOL: SHX5813

## 2015-12-01 HISTORY — DX: Reserved for inherently not codable concepts without codable children: IMO0001

## 2015-12-01 SURGERY — ESOPHAGOGASTRODUODENOSCOPY (EGD) WITH PROPOFOL
Anesthesia: General

## 2015-12-01 MED ORDER — LIDOCAINE HCL (CARDIAC) 20 MG/ML IV SOLN
INTRAVENOUS | Status: DC | PRN
  Administered 2015-12-01: 100 mg via INTRAVENOUS

## 2015-12-01 MED ORDER — MIDAZOLAM HCL 5 MG/5ML IJ SOLN
INTRAMUSCULAR | Status: DC | PRN
  Administered 2015-12-01: 1 mg via INTRAVENOUS

## 2015-12-01 MED ORDER — PROPOFOL 10 MG/ML IV BOLUS
INTRAVENOUS | Status: DC | PRN
  Administered 2015-12-01: 100 mg via INTRAVENOUS

## 2015-12-01 MED ORDER — PROPOFOL 500 MG/50ML IV EMUL
INTRAVENOUS | Status: DC | PRN
  Administered 2015-12-01: 160 ug/kg/min via INTRAVENOUS

## 2015-12-01 MED ORDER — GLYCOPYRROLATE 0.2 MG/ML IJ SOLN
INTRAMUSCULAR | Status: DC | PRN
  Administered 2015-12-01: 0.2 mg via INTRAVENOUS

## 2015-12-01 MED ORDER — SODIUM CHLORIDE 0.9 % IV SOLN: INTRAVENOUS | Status: DC

## 2015-12-01 MED ORDER — FENTANYL CITRATE (PF) 100 MCG/2ML IJ SOLN
INTRAMUSCULAR | Status: DC | PRN
  Administered 2015-12-01: 50 ug via INTRAVENOUS

## 2015-12-01 MED ORDER — SODIUM CHLORIDE 0.9 % IV SOLN
INTRAVENOUS | Status: DC
  Administered 2015-12-01: 1000 mL via INTRAVENOUS

## 2015-12-01 NOTE — Transfer of Care (Signed)
Immediate Anesthesia Transfer of Care Note  Patient: Carolyn Campos  Procedure(s) Performed: Procedure(s): ESOPHAGOGASTRODUODENOSCOPY (EGD) WITH PROPOFOL (N/A)  Patient Location: PACU  Anesthesia Type:General  Level of Consciousness: awake and patient cooperative  Airway & Oxygen Therapy: Patient Spontanous Breathing and Patient connected to nasal cannula oxygen  Post-op Assessment: Report given to RN and Post -op Vital signs reviewed and stable  Post vital signs: Reviewed and stable  Last Vitals:  Filed Vitals:   12/01/15 0749 12/01/15 0845  BP: 142/59 120/52  Pulse: 70 77  Temp: 36.3 C 35.9 C  Resp: 18 12    Complications: No apparent anesthesia complications

## 2015-12-01 NOTE — Anesthesia Preprocedure Evaluation (Signed)
Anesthesia Evaluation  Patient identified by MRN, date of birth, ID band Patient awake    Reviewed: Allergy & Precautions, NPO status , Patient's Chart, lab work & pertinent test results  Airway Mallampati: III       Dental  (+) Upper Dentures, Lower Dentures   Pulmonary shortness of breath and with exertion,     + decreased breath sounds      Cardiovascular Exercise Tolerance: Good hypertension, Pt. on medications  Rhythm:Regular Rate:Normal     Neuro/Psych    GI/Hepatic negative GI ROS, Neg liver ROS,   Endo/Other  Hypothyroidism Morbid obesity  Renal/GU negative Renal ROS     Musculoskeletal   Abdominal (+) + obese,   Peds negative pediatric ROS (+)  Hematology  (+) anemia ,   Anesthesia Other Findings   Reproductive/Obstetrics                             Anesthesia Physical Anesthesia Plan  ASA: III  Anesthesia Plan: General   Post-op Pain Management:    Induction: Intravenous  Airway Management Planned: Natural Airway and Nasal Cannula  Additional Equipment:   Intra-op Plan:   Post-operative Plan:   Informed Consent: I have reviewed the patients History and Physical, chart, labs and discussed the procedure including the risks, benefits and alternatives for the proposed anesthesia with the patient or authorized representative who has indicated his/her understanding and acceptance.     Plan Discussed with: CRNA  Anesthesia Plan Comments:         Anesthesia Quick Evaluation

## 2015-12-01 NOTE — Anesthesia Postprocedure Evaluation (Signed)
Anesthesia Post Note  Patient: Lorelei PontWanda S Wulff  Procedure(s) Performed: Procedure(s) (LRB): ESOPHAGOGASTRODUODENOSCOPY (EGD) WITH PROPOFOL (N/A)  Patient location during evaluation: PACU Anesthesia Type: MAC Level of consciousness: awake Pain management: pain level controlled Vital Signs Assessment: post-procedure vital signs reviewed and stable Respiratory status: spontaneous breathing Cardiovascular status: blood pressure returned to baseline Anesthetic complications: no    Last Vitals:  Filed Vitals:   12/01/15 0749 12/01/15 0845  BP: 142/59 120/52  Pulse: 70 77  Temp: 36.3 C 35.9 C  Resp: 18 12    Last Pain: There were no vitals filed for this visit.               VAN STAVEREN,Karston Hyland

## 2015-12-01 NOTE — H&P (Addendum)
Outpatient short stay form Pre-procedure 12/01/2015 8:16 AM Carolyn Deem MD  Primary Physician: Dr. Nilda Simmer  Reason for visit:  EGD  History of present illness:  Patient is a 65 year old female presenting today for EGD. She has a long-term history of dysphagia. She does take Prilosec once a day and denies heartburn. There is a barium swallow done on 10/31/2015 that was completely normal. This is including a 13 mm barium tablet. She has dysphagia to both liquids and solids. Is no particular item that seems to be worse.    It is of note patient had a severe injury to the left arm about 8 years ago including neck injury. Her symptoms of dysphagia have been for a long time several years at least. It is also of note patient has been developing a slight left facial tic as well as some changes in her voice pattern.    Current facility-administered medications:  .  0.9 %  sodium chloride infusion, , Intravenous, Continuous, Carolyn Deem, MD, Last Rate: 20 mL/hr at 12/01/15 0804, 1,000 mL at 12/01/15 0804 .  0.9 %  sodium chloride infusion, , Intravenous, Continuous, Carolyn Deem, MD  Prescriptions prior to admission  Medication Sig Dispense Refill Last Dose  . albuterol (PROVENTIL HFA;VENTOLIN HFA) 108 (90 Base) MCG/ACT inhaler Inhale 2 puffs into the lungs every 6 (six) hours as needed for wheezing or shortness of breath. 1 Inhaler 4 Taking  . ALPRAZolam (XANAX) 0.5 MG tablet TAKE (1) TABLET BY MOUTH EVERY NIGHT AT BEDTIME AS NEEDED 30 tablet 5 Taking  . aspirin 81 MG tablet Take 81 mg by mouth daily.   Taking  . atorvastatin (LIPITOR) 10 MG tablet TAKE ONE (1) TABLET BY MOUTH ONCE DAILY 30 tablet 5 Taking  . buPROPion (WELLBUTRIN XL) 300 MG 24 hr tablet Take 1 tablet (300 mg total) by mouth daily. 30 tablet 5 Taking  . Fiber TABS 500 mg. Take one daily.   Taking  . fish oil-omega-3 fatty acids 1000 MG capsule Take 2 g by mouth daily.   Taking  . levothyroxine (SYNTHROID,  LEVOTHROID) 112 MCG tablet Take 1 tablet (112 mcg total) by mouth daily. 30 tablet 5 Taking  . losartan (COZAAR) 25 MG tablet Take 1 tablet (25 mg total) by mouth daily. 30 tablet 5 Taking  . sertraline (ZOLOFT) 100 MG tablet 1 tablet daily 30 tablet 5 Taking  . traZODone (DESYREL) 50 MG tablet Take 1 tablet (50 mg total) by mouth at bedtime as needed. 30 tablet 5 Taking  . triamcinolone cream (KENALOG) 0.1 % Apply topically 2 (two) times daily. 30 g 0 Taking     Allergies  Allergen Reactions  . Cephalexin Rash     Past Medical History  Diagnosis Date  . Pure hypercholesterolemia   . Chest pain, unspecified   . Postmenopausal bleeding   . Abdominal pain, unspecified site   . Chest pain, unspecified   . Anxiety state, unspecified   . Depressive disorder, not elsewhere classified   . Unspecified hypothyroidism   . Insomnia, unspecified   . Heart murmur     hx  . Hypertension   . Shortness of breath dyspnea     Review of systems:      Physical Exam    Heart and lungs: Regular rate and rhythm without rub or gallop lungs bilaterally clear    HEENT: Normocephalic atraumatic eyes are anicteric    Other:     Pertinant exam for procedure: Soft nontender  nondistended bowel sounds positive normoactive    Planned proceedures: EGD and indicated procedures. I have discussed the risks benefits and complications of procedures to include not limited to bleeding, infection, perforation and the risk of sedation and the patient wishes to proceed.We will likely need to consider further testing including modified barium swallow and neurology appointment.    Carolyn DeemMartin U Skulskie, MD Gastroenterology 12/01/2015  8:16 AM

## 2015-12-01 NOTE — Op Note (Addendum)
[facility] Gastroenterology Patient Name: Cristopher EstimableWanda Babers Procedure Date: 12/01/2015 8:39 AM MRN: [id number] Account #: [id number] Date of Birth: 1951/06/24 Admit Type: Outpatient Age: [age] [facility] 3 Gender: Female Note Status: Supervisor Override Procedure:            Upper GI endoscopy Indications:          Dysphagia Providers:            Christena DeemMartin U. Willman Cuny, MD Medicines:            Monitored Anesthesia Care Complications:        No immediate complications. Procedure:            Pre-Anesthesia Assessment:                       - ASA Grade Assessment: III - A patient with severe                        systemic disease.                       After obtaining informed consent, the endoscope was                        passed under direct vision. Throughout the procedure,                        the patient's blood pressure, pulse, and oxygen                        saturations were monitored continuously. The Endoscope                        was introduced through the mouth, and advanced to the                        third part of duodenum. The upper GI endoscopy was                        accomplished without difficulty. The patient tolerated                        the procedure well. Findings:      The Z-line was variable. Biopsies were taken with a cold forceps for       histology.      Abnormal motility was noted in the middle third of the esophagus and in       the lower third of the esophagus. The cricopharyngeus was normal. There       is a decrease in motility of the esophageal body.      Patchy mild inflammation characterized by erythema was found in the       gastric body and in the gastric antrum. Biopsies were taken with a cold       forceps for histology. Biopsies were taken with a cold forceps for       Helicobacter pylori testing.      Three non-obstructing non-bleeding cratered and superficial gastric       ulcers of mild to moderate  severity with pigmented material were found       at the incisura, in the gastric antrum and in the prepyloric region of  the stomach. The largest lesion was 3 mm in largest dimension.      A deformity was found in the gastric antrum.      Patchy mild mucosal variance characterized by altered texture was found       in the duodenal bulb and in the second portion of the duodenum. Biopsies       were taken with a cold forceps for histology. Impression:           - Z-line variable. Biopsied.                       - Abnormal esophageal motility.                       - Gastritis. Biopsied.                       - Non-obstructing non-bleeding gastric ulcers with                        pigmented material.                       - Acquired deformity in the gastric antrum.                       - Mucosal variant in the duodenum. Biopsied. Recommendation:       - Discharge patient to home.                       - Use Protonix (pantoprazole) 40 mg PO BID for 1 month.                       - Use Protonix (pantoprazole) 40 mg PO daily daily.                       - Return to GI clinic in 1 month.                       - Repeat upper endoscopy in 7 weeks to check healing. Procedure Code(s):    --- Professional ---                       [phone], Esophagogastroduodenoscopy, flexible, transoral;                        with biopsy, single or multiple Diagnosis Code(s):    --- Professional ---                       K22.8, Other specified diseases of esophagus                       K22.4, Dyskinesia of esophagus                       K29.70, Gastritis, unspecified, without bleeding                       K25.9, Gastric ulcer, unspecified as acute or chronic,                        without hemorrhage or perforation  K31.89, Other diseases of stomach and duodenum                       R13.10, Dysphagia, unspecified CPT copyright 2016 American Medical Association. All rights  reserved. The codes documented in this report are preliminary and upon coder review may  be revised to meet current compliance requirements. Christena Deem, MD 12/01/2015 8:45:33 AM This report has been signed electronically. Number of Addenda: 0 Note Initiated On: 12/01/2015 8:39 AM      [facility]

## 2015-12-02 LAB — SURGICAL PATHOLOGY

## 2015-12-26 ENCOUNTER — Telehealth: Payer: Self-pay | Admitting: Cardiovascular Disease

## 2015-12-26 NOTE — Telephone Encounter (Signed)
S/w pt who reports she was advised by [facility] Clinic to clear appetite supplements w/her cardiologist before she can get them. She does not know the name of the supplement. Advised pt to call back w/name, dosage and frequency of supplement. She is agreeable w/plan

## 2015-12-26 NOTE — Telephone Encounter (Signed)
Pt states her Bariatric clinic gave her an appetite supplement to curb her appetite. She asks if this is ok for her to take. Please call and advise.

## 2015-12-29 ENCOUNTER — Telehealth: Payer: Self-pay | Admitting: Women's Health

## 2015-12-29 NOTE — Telephone Encounter (Signed)
Left message for pt to call back  °

## 2015-12-29 NOTE — Telephone Encounter (Signed)
Patient returning call from Carolyn Campos.  She says the clinic told her she would be given once of the 3 medications below starting at low dose listed and increasing to max listed.  Please call to discuss.    Phentermine 15 mg - 37.5 mg Tenuate 25 mg - 75 mg  Bontril 35mg  - 105 mg

## 2015-12-29 NOTE — Telephone Encounter (Signed)
Pt calling to let Dr. Kirke CorinArida know what potential meds she would potentially be taking from the Bariatric Center. She would like to know if Dr. Kirke CorinArida is agreeable to her taking any of these meds.

## 2015-12-29 NOTE — Telephone Encounter (Signed)
Pt is calling back with the name of the pills she was given at the Bariatric Center.  Phentermine, Tenuate, and Bontril.  Please call.

## 2015-12-29 NOTE — Telephone Encounter (Signed)
These are fine but I recommend that she monitors her blood pressure and look for symptoms of palpitations.

## 2015-12-29 NOTE — Telephone Encounter (Signed)
Spoke w/ pt.  Advised her of Dr. Jari SportsmanArida's recommendation.   She is agreeable and reports that the Bariatric Center will also be closely monitoring her. Asked her to call back w/ any further questions or concerns.

## 2016-01-01 ENCOUNTER — Encounter: Payer: Self-pay | Admitting: Family Medicine

## 2016-01-05 ENCOUNTER — Telehealth: Payer: Self-pay | Admitting: Cardiovascular Disease

## 2016-01-05 ENCOUNTER — Other Ambulatory Visit: Payer: Self-pay | Admitting: Neurology

## 2016-01-05 DIAGNOSIS — K219 Gastro-esophageal reflux disease without esophagitis: Secondary | ICD-10-CM | POA: Diagnosis not present

## 2016-01-05 DIAGNOSIS — G514 Facial myokymia: Secondary | ICD-10-CM

## 2016-01-05 DIAGNOSIS — R471 Dysarthria and anarthria: Secondary | ICD-10-CM | POA: Diagnosis not present

## 2016-01-05 DIAGNOSIS — R490 Dysphonia: Secondary | ICD-10-CM

## 2016-01-05 DIAGNOSIS — R0683 Snoring: Secondary | ICD-10-CM | POA: Diagnosis not present

## 2016-01-05 DIAGNOSIS — R1310 Dysphagia, unspecified: Secondary | ICD-10-CM | POA: Diagnosis not present

## 2016-01-05 DIAGNOSIS — R1319 Other dysphagia: Secondary | ICD-10-CM | POA: Diagnosis not present

## 2016-01-05 DIAGNOSIS — K259 Gastric ulcer, unspecified as acute or chronic, without hemorrhage or perforation: Secondary | ICD-10-CM | POA: Diagnosis not present

## 2016-01-05 NOTE — Telephone Encounter (Signed)
S/w pt and pt daughter, Patriciaann ClanChrysta, who states Bariatric Center requests documentation from Dr. Kirke CorinArida that he is agreeable to pt taking one of the following medications to assist w/weight loss:  Phentermine 15 mg - 37.5 mg Tenuate 25 mg - 75 mg  Bontril 35mg  - 105 mg   Would start at low dose and increase to max dose. Per Dr. Kirke CorinArida 5/15  phone note: " These are fine but I recommend that she monitors her blood pressure and look for symptoms of palpitations" Informed daughter letter will be mailed to pt home address. She is agreeable w/plan with no further questions.

## 2016-01-05 NOTE — Telephone Encounter (Signed)
Pt is asking for a letter so she can do Bariatric surgery Was told last time by him that she was a good to go see him But she needs a letter stating that Will be picking up when its done.  Please call back when done.

## 2016-01-13 DIAGNOSIS — R0602 Shortness of breath: Secondary | ICD-10-CM | POA: Diagnosis not present

## 2016-01-20 ENCOUNTER — Ambulatory Visit: Payer: Medicare Other | Admitting: Speech Pathology

## 2016-01-23 ENCOUNTER — Ambulatory Visit
Admission: RE | Admit: 2016-01-23 | Discharge: 2016-01-23 | Disposition: A | Discharge status: Home / Self Care | Payer: Medicare Other | Source: Ambulatory Visit | Attending: Neurology | Admitting: Neurology

## 2016-01-23 DIAGNOSIS — R1310 Dysphagia, unspecified: Secondary | ICD-10-CM | POA: Diagnosis not present

## 2016-01-23 DIAGNOSIS — R490 Dysphonia: Secondary | ICD-10-CM | POA: Insufficient documentation

## 2016-01-23 DIAGNOSIS — R471 Dysarthria and anarthria: Secondary | ICD-10-CM

## 2016-01-23 DIAGNOSIS — G514 Facial myokymia: Secondary | ICD-10-CM | POA: Insufficient documentation

## 2016-01-23 DIAGNOSIS — R253 Fasciculation: Secondary | ICD-10-CM | POA: Diagnosis not present

## 2016-01-23 LAB — POCT I-STAT CREATININE: CREATININE: 0.8 mg/dL (ref 0.44–1.00)

## 2016-01-23 MED ORDER — GADOBENATE DIMEGLUMINE 529 MG/ML IV SOLN
20.0000 mL | Freq: Once | INTRAVENOUS | Status: AC | PRN
  Administered 2016-01-23: 20 mL via INTRAVENOUS

## 2016-01-26 ENCOUNTER — Encounter: Payer: Self-pay | Admitting: Speech Pathology

## 2016-01-26 ENCOUNTER — Ambulatory Visit: Payer: Medicare Other | Attending: Neurology | Admitting: Speech Pathology

## 2016-01-26 DIAGNOSIS — R1319 Other dysphagia: Secondary | ICD-10-CM | POA: Insufficient documentation

## 2016-01-26 DIAGNOSIS — R41841 Cognitive communication deficit: Secondary | ICD-10-CM | POA: Diagnosis not present

## 2016-01-26 DIAGNOSIS — R490 Dysphonia: Secondary | ICD-10-CM

## 2016-01-26 DIAGNOSIS — R471 Dysarthria and anarthria: Secondary | ICD-10-CM | POA: Insufficient documentation

## 2016-01-26 NOTE — Therapy (Signed)
[facility] MAIN [facility] [address] [place], [place], [address] Phone: [phone]   Fax:  [phone]  Speech Language Pathology Evaluation  Patient Details  Name: Carolyn Campos MRN: [id number] Date of Birth: 26-Jun-1951 Referring Provider: Dr. Sherryll BurgerShah  Encounter Date: 01/26/2016      End of Session - 01/26/16 1424    Visit Number 1   Number of Visits 17   Date for SLP Re-Evaluation 03/27/16   SLP Start Time 1100   SLP Stop Time  1200   SLP Time Calculation (min) 60 min   Activity Tolerance Patient tolerated treatment well      Past Medical History  Diagnosis Date  . Pure hypercholesterolemia   . Chest pain, unspecified   . Postmenopausal bleeding   . Abdominal pain, unspecified site   . Chest pain, unspecified   . Anxiety state, unspecified   . Depressive disorder, not elsewhere classified   . Unspecified hypothyroidism   . Insomnia, unspecified   . Heart murmur     hx  . Hypertension   . Shortness of breath dyspnea     Past Surgical History  Procedure Laterality Date  . Cesarean section      x 2  . Dilation and curettage of uterus    . Arm surgery      x 4 post accident at work[place] [facility]  . Abdominal hysterectomy  04/16/2012    one ovary resected.  No malignancy.  Post-menopausal bleeding.  [facility].  . Colonoscopy  08/16/2009    normal.  [facility].  Repeat in 10 years.  . Esophagogastroduodenoscopy (egd) with propofol N/A 12/01/2015    Procedure: ESOPHAGOGASTRODUODENOSCOPY (EGD) WITH PROPOFOL;  Surgeon: Christena DeemMartin U Skulskie, MD;  [facility];  Service: Endoscopy;  Laterality: N/A;    There were no vitals filed for this visit.      Subjective Assessment - 01/26/16 1421    Subjective 65 year old woman with history of anxiety/depression reporting speech problems ("I just don't make any sense", "my voice sounds funny") and swallowing problems ("get choked real easy").  Swallowing  has been evaluated with barium swallow study 10/31/2015 ("normal pharyngeal anatomy and motility") and upper endoscopy ("decreased motility").  She had an MRI 01/23/2016 showing moderate chronic small-vessel ischemic changes.  Recent pulmonary function test ("spirometry suggests restriction").              SLP Evaluation [facility] - 01/26/16 0001    SLP Visit Information   SLP Received On 01/26/16   Referring Provider Dr. Sherryll BurgerShah   Onset Date 01/05/2016   Medical Diagnosis Speech impairment and dysphagia   Prior Functional Status   Cognitive/Linguistic Baseline Within functional limits   Cognition   Overall Cognitive Status Impaired/Different from baseline   Area of Impairment Memory;Problem solving  Visuospatial; abstraction   Oral Motor/Sensory Function   Overall Oral Motor/Sensory Function Appears within functional limits for tasks assessed  Minimal lip tic, otherwise [facility]   Motor Speech   Overall Motor Speech Impaired   Respiration Impaired   Level of Impairment Word   Phonation Hoarse  Strained   Resonance Within functional limits   Articulation Within functional limitis  Inconsistent voicing errors, otherwise [facility]   Intelligibility Intelligible   Phonation Impaired   Vocal Abuses --  Hoarse, strained vocal quality   Tension Present Shoulder;Neck;Jaw   Volume Appropriate   Pitch --  Limited pitch range   Standardized Assessments   Standardized Assessments  Montreal Cognitive Assessment (MOCA)       Montreal Cognitive Assessment (MOCA) Version: 7.1 Visuospatieal/Executive Alternating trail making       0/1 Event organiser (copy 3-d design) 0/1 Draw a clock     2/3 Naming     3/3 Attention Forward digit span    1/1 Backward digit span    0/1 Vigilance     1/1 Serial 7's     3/3 Language  Verbal Fluency    0/1 Repetition     1/2 Abstraction     0/2 Delayed Recall    3/5 Orientation     6/6 TOTAL     20/30            SLP Education - 01/26/16 1423     Education provided Yes   Education Details Potential speech therapy goals   Person(s) Educated Patient   Methods Explanation   Comprehension Verbalized understanding            SLP Long Term Goals - 01/26/16 1521    SLP LONG TERM GOAL #1   Title The patient will demonstrate independent understanding of vocal hygiene concepts and neck, shoulder, lingual stretching exercises.   Time 8   Period Weeks   Status New   SLP LONG TERM GOAL #2   Title The patient will be independent for abdominal breathing and breath support exercises.   Time 8   Period Weeks   Status New   SLP LONG TERM GOAL #3   Title The patient will minimize vocal tension via Yawn-Sigh approach (or comparable technique) with min SLP cues with 80% accuracy.   Time 8   Period Weeks   Status New   SLP LONG TERM GOAL #4   Title The patient will maintain relaxed phonation / oral resonance for paragraph length recitation with 80% accuracy.   Time 8   Period Weeks   Status New          Plan - 01/26/16 1508    Clinical Impression Statement 65 year old woman, under the care of Dr. Sherryll Burger with potential diagnosis of bulbar myasthenia gravis, is presenting with moderate dysphonia, mild dysarthria, and dysphagia. The patient is not able to fully describe her communication and swallowing complaints.  The patient has been followed by GI for dysphagia and testing indicates an esophageal dysphagia vs. oropharyngeal.  The results of the [facility] Cognitive Assessment indicate that the patient has cognitive-communication difficulties in the areas of visuospatial/executive function, attention, abstraction, and memory.  Perceptual voice evaluation indicates moderate dysphonia characterized by hoarse/strained vocal quality and poor respiratory support.  With exception of inconsistent voicing errors, the patient did not have significant articulation errors in terms of place or manner.  The patient became very emotional/teary during the  administration of the MOCA, but could not clearly state why she was crying.  The patient will benefit from voice therapy to address dysphonia and ongoing assessment for need in speech, cognitive-communication, and swallowing that can be address via speech therapy.  In view of the patent's dysphonia, she may benefit from referral to ENT.   Speech Therapy Frequency 2x / week   Duration 4 weeks   Treatment/Interventions Patient/family education;Other (comment)  Voice therapy, ongoing assessment   Potential to Achieve Goals Good   Potential Considerations Ability to learn/carryover information;Co-morbidities;Cooperation/participation level;Medical prognosis;Previous level of function   SLP Home Exercise Plan To be determined   Consulted and Agree with Plan of Care Patient      Patient will  benefit from skilled therapeutic intervention in order to improve the following deficits and impairments:   Dysphonia - Plan: SLP plan of care cert/re-cert  Dysarthria and anarthria - Plan: SLP plan of care cert/re-cert  Cognitive communication deficit - Plan: SLP plan of care cert/re-cert  Other dysphagia - Plan: SLP plan of care cert/re-cert      G-Codes - 02-06-2016 1524    Functional Assessment Tool Used Perceptual voice evaluation, clinical judgment   Functional Limitations Voice   Voice Current Status (G9171) At least 40 percent but less than 60 percent impaired, limited or restricted   Voice Goal Status (G9172) At least 1 percent but less than 20 percent impaired, limited or restricted      Problem List Patient Active Problem List   Diagnosis Date Noted  . Obesity (BMI 30-39.9) 03/28/2014  . Essential hypertension, benign 06/20/2013  . Essential hypertension 12/26/2012  . Routine general medical examination at a health care facility 12/17/2012  . Anemia, unspecified 12/17/2012  . Other abnormal glucose 12/17/2012  . Dyspnea on exertion 12/17/2012  . Seborrhea capitis 12/17/2012  . Pure  hypercholesterolemia 12/17/2012  . Breast cancer screening 12/17/2012  . IT band syndrome 12/17/2012  . Chest pain 11/24/2012  . SOB (shortness of breath) 11/24/2012  . Hypothyroidism 07/21/2012  . Anxiety and depression 07/21/2012  . Insomnia 07/21/2012  . Urticaria 07/21/2012    Leandrew Koyanagi 02-06-16, 3:31 PM  [place] [facility] MAIN [facility] SERVICES [address] [place], [place], [address] Phone: [phone]   Fax:  [phone]  Name: Carolyn Campos MRN: [id number] Date of Birth: Jul 03, 1951

## 2016-01-29 ENCOUNTER — Ambulatory Visit: Payer: Medicare Other | Admitting: Speech Pathology

## 2016-01-29 DIAGNOSIS — R471 Dysarthria and anarthria: Secondary | ICD-10-CM | POA: Diagnosis not present

## 2016-01-29 DIAGNOSIS — R490 Dysphonia: Secondary | ICD-10-CM

## 2016-01-29 DIAGNOSIS — R41841 Cognitive communication deficit: Secondary | ICD-10-CM

## 2016-01-29 DIAGNOSIS — R1319 Other dysphagia: Secondary | ICD-10-CM

## 2016-01-30 ENCOUNTER — Encounter: Payer: Self-pay | Admitting: Speech Pathology

## 2016-01-30 NOTE — Therapy (Signed)
[facility]IN [facility] [address] [place], [place], [address] Phone: [phone]   Fax:  [phone]  Speech Language Pathology Treatment  Patient Details  Name: Carolyn Campos MRN: [id number] Date of Birth: 20-Jul-1951 Referring Provider: Dr. Sherryll BurgerShah  Encounter Date: 01/29/2016      End of Session - 01/30/16 0926    Visit Number 2   Number of Visits 17   Date for SLP Re-Evaluation 03/27/16   SLP Start Time 1600   SLP Stop Time  1700   SLP Time Calculation (min) 60 min   Activity Tolerance Patient tolerated treatment well      Past Medical History  Diagnosis Date  . Pure hypercholesterolemia   . Chest pain, unspecified   . Postmenopausal bleeding   . Abdominal pain, unspecified site   . Chest pain, unspecified   . Anxiety state, unspecified   . Depressive disorder, not elsewhere classified   . Unspecified hypothyroidism   . Insomnia, unspecified   . Heart murmur     hx  . Hypertension   . Shortness of breath dyspnea     Past Surgical History  Procedure Laterality Date  . Cesarean section      x 2  . Dilation and curettage of uterus    . Arm surgery      x 4 post accident at [facility]  . Abdominal hysterectomy  04/16/2012    one ovary resected.  No malignancy.  Post-menopausal bleeding.  [facility].  . Colonoscopy  08/16/2009    normal.  [facility].  Repeat in 10 years.  . Esophagogastroduodenoscopy (egd) with propofol N/A 12/01/2015    Procedure: ESOPHAGOGASTRODUODENOSCOPY (EGD) WITH PROPOFOL;  Surgeon: Christena DeemMartin U Skulskie, MD;  [facility];  Service: Endoscopy;  Laterality: N/A;    There were no vitals filed for this visit.      Subjective Assessment - 01/30/16 0925    Subjective "I've always been nervous"   Currently in Pain? No/denies               ADULT SLP TREATMENT - 01/30/16 0001    General Information   Behavior/Cognition Alert;Cooperative;Pleasant  mood;Other (comment)  Anxious   Treatment Provided   Treatment provided Cognitive-Linquistic   Pain Assessment   Pain Assessment No/denies pain   Cognitive-Linquistic Treatment   Treatment focused on Voice;Dysarthria;Cognition   Skilled Treatment The patient was provided with written and verbal teaching regarding neck, shoulder, tongue, and throat stretches exercises to promote relaxed phonation. The patient was provided with written and verbal teaching for blowing exercises to promote continual flow phonation.  The patient was provided with written and verbal teaching regarding abdominal breathing to promote deeper breathing.  Patient instructed in relaxed phonation / oral resonance. Not able to maintains oral resonance beyond a few seconds.   Assessment / Recommendations / Plan   Plan Continue with current plan of care   Progression Toward Goals   Progression toward goals Progressing toward goals          SLP Education - 01/30/16 0926    Education provided Yes   Education Details Need to relax intrinsic and extrinsic laryngeal muscles   Person(s) Educated Patient   Methods Explanation   Comprehension Verbalized understanding            SLP Long Term Goals - 01/26/16 1521    SLP LONG TERM GOAL #1   Title The patient will demonstrate independent understanding of vocal hygiene  concepts and neck, shoulder, lingual stretching exercises.   Time 8   Period Weeks   Status New   SLP LONG TERM GOAL #2   Title The patient will be independent for abdominal breathing and breath support exercises.   Time 8   Period Weeks   Status New   SLP LONG TERM GOAL #3   Title The patient will minimize vocal tension via Yawn-Sigh approach (or comparable technique) with min SLP cues with 80% accuracy.   Time 8   Period Weeks   Status New   SLP LONG TERM GOAL #4   Title The patient will maintain relaxed phonation / oral resonance for paragraph length recitation with 80% accuracy.   Time 8    Period Weeks   Status New          Plan - 01/30/16 1610    Clinical Impression Statement The patient continues to have difficulty fully describing her speech problems.  In therapy, I have only heard dysphonia (hoarse/strained vocal quality).  However, some of the words that the patient uses to describe her difficulties indicate at times that her speech is incoherent and/or slurred.     Speech Therapy Frequency 2x / week   Duration 4 weeks   Treatment/Interventions Patient/family education;Other (comment)  Voice therapy   Potential to Achieve Goals Good   Potential Considerations Ability to learn/carryover information;Co-morbidities;Cooperation/participation level;Medical prognosis;Previous level of function   SLP Home Exercise Plan Neck, shoulder, tongue, and throat stretches; abdominal breathing; blowing/voiceless sustained sounds; oral resonance/relaxed phonation      Patient will benefit from skilled therapeutic intervention in order to improve the following deficits and impairments:   Dysphonia  Dysarthria and anarthria  Cognitive communication deficit  Other dysphagia    Problem List Patient Active Problem List   Diagnosis Date Noted  . Obesity (BMI 30-39.9) 03/28/2014  . Essential hypertension, benign 06/20/2013  . Essential hypertension 12/26/2012  . Routine general medical examination at a health care facility 12/17/2012  . Anemia, unspecified 12/17/2012  . Other abnormal glucose 12/17/2012  . Dyspnea on exertion 12/17/2012  . Seborrhea capitis 12/17/2012  . Pure hypercholesterolemia 12/17/2012  . Breast cancer screening 12/17/2012  . IT band syndrome 12/17/2012  . Chest pain 11/24/2012  . SOB (shortness of breath) 11/24/2012  . Hypothyroidism 07/21/2012  . Anxiety and depression 07/21/2012  . Insomnia 07/21/2012  . Urticaria 07/21/2012   Carolyn Primrose, MS/CCC- SLP  Leandrew Koyanagi 01/30/2016, 9:31 AM  [facility]  MAIN [facility] SERVICES [address] [place], [place], [address] Phone: [phone]   Fax:  [phone]   Name: Carolyn Campos MRN: [id number] Date of Birth: 01-01-1951

## 2016-02-02 ENCOUNTER — Encounter: Payer: Self-pay | Admitting: Speech Pathology

## 2016-02-02 ENCOUNTER — Ambulatory Visit: Payer: Medicare Other | Admitting: Speech Pathology

## 2016-02-02 DIAGNOSIS — R471 Dysarthria and anarthria: Secondary | ICD-10-CM | POA: Diagnosis not present

## 2016-02-02 DIAGNOSIS — R490 Dysphonia: Secondary | ICD-10-CM

## 2016-02-02 DIAGNOSIS — R1319 Other dysphagia: Secondary | ICD-10-CM | POA: Diagnosis not present

## 2016-02-02 DIAGNOSIS — R41841 Cognitive communication deficit: Secondary | ICD-10-CM | POA: Diagnosis not present

## 2016-02-02 NOTE — Therapy (Signed)
[facility] [address] [place], [place], [address] Phone: [phone]   Fax:  [phone]  Speech Language Pathology Treatment  Patient Details  Name: Carolyn PontWanda S Ditommaso MRN: [id number] Date of Birth: [birth year] Referring Provider: Dr. Sherryll BurgerShah  Encounter Date: 02/02/2016      End of Session - 02/02/16 1728    Visit Number 2   Number of Visits 17   Date for SLP Re-Evaluation 03/27/16   SLP Start Time 1600   SLP Stop Time  1700   SLP Time Calculation (min) 60 min   Activity Tolerance Patient tolerated treatment well      Past Medical History  Diagnosis Date  . Pure hypercholesterolemia   . Chest pain, unspecified   . Postmenopausal bleeding   . Abdominal pain, unspecified site   . Chest pain, unspecified   . Anxiety state, unspecified   . Depressive disorder, not elsewhere classified   . Unspecified hypothyroidism   . Insomnia, unspecified   . Heart murmur     hx  . Hypertension   . Shortness of breath dyspnea     Past Surgical History  Procedure Laterality Date  . Cesarean section      x 2  . Dilation and curettage of uterus    . Arm surgery      x 4 post accident at [facility]  . Abdominal hysterectomy  04/16/2012    one ovary resected.  No malignancy.  Post-menopausal bleeding.  [facility].  . Colonoscopy  08/16/2009    normal.  [facility].  Repeat in 10 years.  . Esophagogastroduodenoscopy (egd) with propofol N/A 12/01/2015    Procedure: ESOPHAGOGASTRODUODENOSCOPY (EGD) WITH PROPOFOL;  Surgeon: Christena DeemMartin U Skulskie, MD;  [facility];  Service: Endoscopy;  Laterality: N/A;    There were no vitals filed for this visit.      Subjective Assessment - 02/02/16 1727    Subjective Carolyn Campos was alert and engaged throughout the session. She reports that she completed the exercises provided at the last session successfully. She remains discouraged by her voice being "weird"  but is hopeful it will improve. She states that she has always been a tense person.   Currently in Pain? No/denies               ADULT SLP TREATMENT - 02/02/16 0001    General Information   Behavior/Cognition Alert;Cooperative;Pleasant mood;Other (comment)  Anxious   Treatment Provided   Treatment provided Cognitive-Linquistic   Pain Assessment   Pain Assessment No/denies pain   Cognitive-Linquistic Treatment   Treatment focused on Voice;Dysarthria;Cognition   Skilled Treatment Neck and tongue stretches were reviewed and completed. Breathing exercises including yawn/sigh, deep breathing, blowing bubbles in a cup, and straw phonation were also attempted with moderate success. Carolyn Campos was able to hum for an average of 14 seconds during which time the voicing sporadically started and stopped. Initially Carolyn Campos was able to sustain an "ah" sound for approx. 7 seconds at which point it ceased or became a growl. With full cues from the clinician (tension awareness, physically monitoring shoulder elevation during breath, modeling, indications during phonation to increase air), she was able to increase to an average of 11 seconds with much less growl. Her vocal quality remains strained.   Assessment / Recommendations / Plan   Plan Continue with current plan of care   Progression Toward Goals   Progression toward goals Progressing toward goals  SLP Education - 02/02/16 1727    Education provided Yes   Education Details Importance of relaxed phonation and breath support   Person(s) Educated Patient   Methods Explanation;Demonstration   Comprehension Verbalized understanding;Returned demonstration            SLP Long Term Goals - 01/26/16 1521    SLP LONG TERM GOAL #1   Title The patient will demonstrate independent understanding of vocal hygiene concepts and neck, shoulder, lingual stretching exercises.   Time 8   Period Weeks   Status New   SLP LONG TERM GOAL  #2   Title The patient will be independent for abdominal breathing and breath support exercises.   Time 8   Period Weeks   Status New   SLP LONG TERM GOAL #3   Title The patient will minimize vocal tension via Yawn-Sigh approach (or comparable technique) with min SLP cues with 80% accuracy.   Time 8   Period Weeks   Status New   SLP LONG TERM GOAL #4   Title The patient will maintain relaxed phonation / oral resonance for paragraph length recitation with 80% accuracy.   Time 8   Period Weeks   Status New          Plan - 02/02/16 1728    Clinical Impression Statement Ms. Ketchem is making progress on improving her breath support for phonation and monitoring her air supply. She was instructed to continue the home exercises for 10-15 min/day. She will benefit from continued skilled intervention to address her vocal quality and sporadic voicing.   Speech Therapy Frequency 2x / week   Duration 4 weeks   Treatment/Interventions Patient/family education;Other (comment)   Potential to Achieve Goals Good   Potential Considerations Ability to learn/carryover information;Co-morbidities;Cooperation/participation level;Medical prognosis;Previous level of function   SLP Home Exercise Plan Neck, shoulder, tongue, and throat stretches; abdominal breathing; blowing/voiceless sustained sounds; oral resonance/relaxed phonation   Consulted and Agree with Plan of Care Patient      Patient will benefit from skilled therapeutic intervention in order to improve the following deficits and impairments:   Dysphonia    Problem List Patient Active Problem List   Diagnosis Date Noted  . Obesity (BMI 30-39.9) 03/28/2014  . Essential hypertension, benign 06/20/2013  . Essential hypertension 12/26/2012  . Routine general medical examination at a health care facility 12/17/2012  . Anemia, unspecified 12/17/2012  . Other abnormal glucose 12/17/2012  . Dyspnea on exertion 12/17/2012  . Seborrhea capitis  12/17/2012  . Pure hypercholesterolemia 12/17/2012  . Breast cancer screening 12/17/2012  . IT band syndrome 12/17/2012  . Chest pain 11/24/2012  . SOB (shortness of breath) 11/24/2012  . Hypothyroidism 07/21/2012  . Anxiety and depression 07/21/2012  . Insomnia 07/21/2012  . Urticaria 07/21/2012    Randall An 02/02/2016, 5:28 PM  [facility] MAIN [facility] SERVICES [address] [place], [place], [address] Phone: [phone]   Fax:  [phone]   Name: KIMIE PIDCOCK MRN: [id number] Date of Birth: 07/22/1951

## 2016-02-06 ENCOUNTER — Ambulatory Visit: Payer: Medicare Other | Admitting: Speech Pathology

## 2016-02-06 ENCOUNTER — Encounter: Payer: Self-pay | Admitting: Speech Pathology

## 2016-02-06 DIAGNOSIS — R490 Dysphonia: Secondary | ICD-10-CM | POA: Diagnosis not present

## 2016-02-06 DIAGNOSIS — R1319 Other dysphagia: Secondary | ICD-10-CM | POA: Diagnosis not present

## 2016-02-06 DIAGNOSIS — R41841 Cognitive communication deficit: Secondary | ICD-10-CM | POA: Diagnosis not present

## 2016-02-06 DIAGNOSIS — R471 Dysarthria and anarthria: Secondary | ICD-10-CM

## 2016-02-06 NOTE — Therapy (Signed)
[facility] [address] , [place], [address] Phone: [phone]   Fax:  [phone]  Speech Language Pathology Treatment  Patient Details  Name: Carolyn Campos MRN: [id number] Date of Birth: 03/20/1951 Referring Provider: Dr. Sherryll BurgerShah  Encounter Date: 02/06/2016      End of Session - 02/06/16 1514    Visit Number 3   Number of Visits 17   Date for SLP Re-Evaluation 03/27/16   SLP Start Time 1413   SLP Stop Time  1505   SLP Time Calculation (min) 52 min   Activity Tolerance Patient tolerated treatment well      Past Medical History  Diagnosis Date  . Pure hypercholesterolemia   . Chest pain, unspecified   . Postmenopausal bleeding   . Abdominal pain, unspecified site   . Chest pain, unspecified   . Anxiety state, unspecified   . Depressive disorder, not elsewhere classified   . Unspecified hypothyroidism   . Insomnia, unspecified   . Heart murmur     hx  . Hypertension   . Shortness of breath dyspnea     Past Surgical History  Procedure Laterality Date  . Cesarean section      x 2  . Dilation and curettage of uterus    . Arm surgery      x 4 [facility]  . Abdominal hysterectomy  04/16/2012    one ovary resected.  No malignancy.  Post-menopausal bleeding.  [facility].  . Colonoscopy  08/16/2009    normal.  [facility].  Repeat in 10 years.  . Esophagogastroduodenoscopy (egd) with propofol N/A 12/01/2015    Procedure: ESOPHAGOGASTRODUODENOSCOPY (EGD) WITH PROPOFOL;  Surgeon: Christena DeemMartin U Skulskie, MD;  [facility];  Service: Endoscopy;  Laterality: N/A;    There were no vitals filed for this visit.      Subjective Assessment - 02/06/16 1514    Subjective The patient reports that she is feeling much less stressed/anxious about speech therapy   Currently in Pain? No/denies               ADULT SLP TREATMENT - 02/06/16 0001    General  Information   Behavior/Cognition Alert;Cooperative;Pleasant mood;Other (comment)  Anxious- less so today   Treatment Provided   Treatment provided Cognitive-Linquistic   Pain Assessment   Pain Assessment No/denies pain   Cognitive-Linquistic Treatment   Treatment focused on Voice;Dysarthria;Cognition   Skilled Treatment The patient was provided with written and verbal teaching regarding neck, shoulder, tongue, and throat stretches exercises to promote relaxed phonation. The patient was provided with written and verbal Patient reports that she is breathing deep during activities such as walking.  The patient was instructed in rescue breathing when choking; she reports that she had a choking episode yesterday ad successfully implemented rescue breathing.  Patient instructed in relaxed phonation / oral resonance. The patient maintained relaxed phonation / oral resonance X55 minutes with 80% accuracy.  Patient demonstrated 3 episodes of imprecise articulation- cleared by using slowed rate.     Assessment / Recommendations / Plan   Plan Continue with current plan of care   Progression Toward Goals   Progression toward goals Progressing toward goals          SLP Education - 02/06/16 1517    Education provided Yes   Education Details irescue breathing   Person(s) Educated Patient   Methods Explanation   Comprehension Verbalized understanding  SLP Long Term Goals - 01/26/16 1521    SLP LONG TERM GOAL #1   Title The patient will demonstrate independent understanding of vocal hygiene concepts and neck, shoulder, lingual stretching exercises.   Time 8   Period Weeks   Status New   SLP LONG TERM GOAL #2   Title The patient will be independent for abdominal breathing and breath support exercises.   Time 8   Period Weeks   Status New   SLP LONG TERM GOAL #3   Title The patient will minimize vocal tension via Yawn-Sigh approach (or comparable technique) with min SLP cues with  80% accuracy.   Time 8   Period Weeks   Status New   SLP LONG TERM GOAL #4   Title The patient will maintain relaxed phonation / oral resonance for paragraph length recitation with 80% accuracy.   Time 8   Period Weeks   Status New          Plan - 02/06/16 1515    Clinical Impression Statement The patient returns today with much improved vocal quality, using a relaxed phonation and oral resonance.  She demonstrated some imprecise articulation, which is effectively managed by reminders to speak slow and clear.  The patient was able to implement rescue breathing to manage a choking incident.     Speech Therapy Frequency 2x / week   Duration 4 weeks   Treatment/Interventions Patient/family education;Other (comment)  Voice therapy, ongoing assessment   Potential to Achieve Goals Good   Potential Considerations Ability to learn/carryover information;Co-morbidities;Cooperation/participation level;Medical prognosis;Previous level of function   SLP Home Exercise Plan Neck, shoulder, tongue, and throat stretches; abdominal breathing; blowing/voiceless sustained sounds; oral resonance/relaxed phonation; rescue breathing   Consulted and Agree with Plan of Care Patient      Patient will benefit from skilled therapeutic intervention in order to improve the following deficits and impairments:   Dysphonia  Dysarthria and anarthria  Cognitive communication deficit  Other dysphagia    Problem List Patient Active Problem List   Diagnosis Date Noted  . Obesity (BMI 30-39.9) 03/28/2014  . Essential hypertension, benign 06/20/2013  . Essential hypertension 12/26/2012  . Routine general medical examination at a health care facility 12/17/2012  . Anemia, unspecified 12/17/2012  . Other abnormal glucose 12/17/2012  . Dyspnea on exertion 12/17/2012  . Seborrhea capitis 12/17/2012  . Pure hypercholesterolemia 12/17/2012  . Breast cancer screening 12/17/2012  . IT band syndrome 12/17/2012  .  Chest pain 11/24/2012  . SOB (shortness of breath) 11/24/2012  . Hypothyroidism 07/21/2012  . Anxiety and depression 07/21/2012  . Insomnia 07/21/2012  . Urticaria 07/21/2012   Dollene PrimroseSusan G Aizah Gehlhausen, MS/CCC- SLP  Leandrew KoyanagiAbernathy, Susie 02/06/2016, 3:19 PM  [facility] [facility] [address] [place], [place], [address] Phone: [phone]   Fax:  [phone]   Name: Carolyn PontWanda S Campos MRN: [id number] Date of Birth: 1951/03/08

## 2016-02-12 ENCOUNTER — Ambulatory Visit: Payer: Medicare Other | Admitting: Speech Pathology

## 2016-02-18 ENCOUNTER — Ambulatory Visit: Payer: Medicare Other | Attending: Neurology | Admitting: Speech Pathology

## 2016-02-18 ENCOUNTER — Encounter: Payer: Self-pay | Admitting: Speech Pathology

## 2016-02-18 DIAGNOSIS — R471 Dysarthria and anarthria: Secondary | ICD-10-CM | POA: Insufficient documentation

## 2016-02-18 DIAGNOSIS — R490 Dysphonia: Secondary | ICD-10-CM | POA: Diagnosis not present

## 2016-02-18 NOTE — Therapy (Signed)
[facility] MAIN [facility] [address] [place], [place], [address] Phone: [phone]   Fax:  [phone]  Speech Language Pathology Treatment  Patient Details  Name: Carolyn Campos Crocker MRN: [id number] Date of Birth: December 08, 1950 Referring Provider: Dr. Sherryll BurgerShah  Encounter Date: 02/18/2016      End of Session - 02/18/16 1325    Visit Number 4   Number of Visits 17   Date for SLP Re-Evaluation 03/27/16   SLP Start Time 0955   SLP Stop Time  1055   SLP Time Calculation (min) 60 min   Activity Tolerance Patient tolerated treatment well      Past Medical History  Diagnosis Date  . Pure hypercholesterolemia   . Chest pain, unspecified   . Postmenopausal bleeding   . Abdominal pain, unspecified site   . Chest pain, unspecified   . Anxiety state, unspecified   . Depressive disorder, not elsewhere classified   . Unspecified hypothyroidism   . Insomnia, unspecified   . Heart murmur     hx  . Hypertension   . Shortness of breath dyspnea     Past Surgical History  Procedure Laterality Date  . Cesarean section      x 2  . Dilation and curettage of uterus    . Arm surgery      x 4 post accident at work, [place] [facility]  . Abdominal hysterectomy  04/16/2012    one ovary resected.  No malignancy.  Post-menopausal bleeding.  [facility].  . Colonoscopy  08/16/2009    normal.  [facility].  Repeat in 10 years.  . Esophagogastroduodenoscopy (egd) with propofol N/A 12/01/2015    Procedure: ESOPHAGOGASTRODUODENOSCOPY (EGD) WITH PROPOFOL;  Surgeon: Christena DeemMartin U Skulskie, MD;  [facility];  Service: Endoscopy;  Laterality: N/A;    There were no vitals filed for this visit.      Subjective Assessment - 02/18/16 1324    Subjective [name] reports that she thinks her voice has improved slightly but she still "doesn't make sense sometimes". She has used the rescue breath technique when she finds herself in a coughing fit and  feels it is helpful.   Currently in Pain? No/denies               ADULT SLP TREATMENT - 02/18/16 0001    General Information   Behavior/Cognition Alert;Cooperative;Pleasant mood;Other (comment)  Anxious- less so today   Treatment Provided   Treatment provided Cognitive-Linquistic   Pain Assessment   Pain Assessment No/denies pain   Cognitive-Linquistic Treatment   Treatment focused on Voice;Dysarthria;Cognition   Skilled Treatment Neck stretches and breathing exercises were reviewed, as well as strategies to recover her voice when she feels it goes south. She was able to hum with sufficient air 80% of the time with mod cues (reminders to increase air instead of tensing when the voice waivers). She produced sentences to describe photographs with good vocal quality 75% of the time with mod cues. During open conversation, her vocal quality increases to 85% without cueing. 3 cases of word mis-articulation were noted and improved (but not perfected) with a slower rate of speech.   Assessment / Recommendations / Plan   Plan Continue with current plan of care   Progression Toward Goals   Progression toward goals Progressing toward goals          SLP Education - 02/18/16 1324    Education provided Yes   Education Details Breath support during phonation  Person(Campos) Educated Patient   Methods Explanation   Comprehension Verbalized understanding            SLP Long Term Goals - 01/26/16 1521    SLP LONG TERM GOAL #1   Title The patient will demonstrate independent understanding of vocal hygiene concepts and neck, shoulder, lingual stretching exercises.   Time 8   Period Weeks   Status New   SLP LONG TERM GOAL #2   Title The patient will be independent for abdominal breathing and breath support exercises.   Time 8   Period Weeks   Status New   SLP LONG TERM GOAL #3   Title The patient will minimize vocal tension via Yawn-Sigh approach (or comparable technique) with min  SLP cues with 80% accuracy.   Time 8   Period Weeks   Status New   SLP LONG TERM GOAL #4   Title The patient will maintain relaxed phonation / oral resonance for paragraph length recitation with 80% accuracy.   Time 8   Period Weeks   Status New          Plan - 02/18/16 1325    Clinical Impression Statement [name] is improving her ability to monitor her airflow well. Compared to conversation, her voice is significantly worse when asked to perform a task which requires a light cognitive load, such as describing a picture. She will benefit from continued skilled intervention to develop skills to improve her vocal quality and to identify possible causes of her difficulties.   Speech Therapy Frequency 2x / week   Duration 4 weeks   Treatment/Interventions Patient/family education;Other (comment)   Potential to Achieve Goals Good   Potential Considerations Ability to learn/carryover information;Co-morbidities;Cooperation/participation level;Medical prognosis;Previous level of function   SLP Home Exercise Plan Neck, shoulder, tongue, and throat stretches; abdominal breathing; blowing/voiceless sustained sounds; oral resonance/relaxed phonation; rescue breathing   Consulted and Agree with Plan of Care Patient      Patient will benefit from skilled therapeutic intervention in order to improve the following deficits and impairments:   Dysphonia  Dysarthria and anarthria    Problem List Patient Active Problem List   Diagnosis Date Noted  . Obesity (BMI 30-39.9) 03/28/2014  . Essential hypertension, benign 06/20/2013  . Essential hypertension 12/26/2012  . Routine general medical examination at a health care facility 12/17/2012  . Anemia, unspecified 12/17/2012  . Other abnormal glucose 12/17/2012  . Dyspnea on exertion 12/17/2012  . Seborrhea capitis 12/17/2012  . Pure hypercholesterolemia 12/17/2012  . Breast cancer screening 12/17/2012  . IT band syndrome 12/17/2012  .  Chest pain 11/24/2012  . SOB (shortness of breath) 11/24/2012  . Hypothyroidism 07/21/2012  . Anxiety and depression 07/21/2012  . Insomnia 07/21/2012  . Urticaria 07/21/2012    Randall AnEmily Loyal Rudy 02/18/2016, 1:26 PM  [place] [facility] [address] [place], [place], [address] Phone: [phone]   Fax:  [phone]   Name: Carolyn Campos Pangilinan MRN: [id number] Date of Birth: 12-31-1950

## 2016-02-20 ENCOUNTER — Ambulatory Visit: Payer: Medicare Other | Admitting: Speech Pathology

## 2016-02-27 ENCOUNTER — Ambulatory Visit: Payer: Medicare Other | Admitting: Speech Pathology

## 2016-03-10 DIAGNOSIS — R1319 Other dysphagia: Secondary | ICD-10-CM | POA: Diagnosis not present

## 2016-03-10 DIAGNOSIS — R490 Dysphonia: Secondary | ICD-10-CM | POA: Diagnosis not present

## 2016-03-12 ENCOUNTER — Encounter: Payer: Self-pay | Admitting: *Deleted

## 2016-03-15 ENCOUNTER — Encounter
Admission: RE | Disposition: A | Discharge status: Home / Self Care | Payer: Self-pay | Source: Ambulatory Visit | Attending: Gastroenterology

## 2016-03-15 ENCOUNTER — Ambulatory Visit: Payer: Medicare Other | Admitting: Certified Registered"

## 2016-03-15 ENCOUNTER — Encounter: Payer: Self-pay | Admitting: *Deleted

## 2016-03-15 ENCOUNTER — Ambulatory Visit
Admission: RE | Admit: 2016-03-15 | Discharge: 2016-03-15 | Disposition: A | Discharge status: Home / Self Care | Payer: Medicare Other | Source: Ambulatory Visit | Attending: Gastroenterology | Admitting: Gastroenterology

## 2016-03-15 DIAGNOSIS — I10 Essential (primary) hypertension: Secondary | ICD-10-CM | POA: Insufficient documentation

## 2016-03-15 DIAGNOSIS — Z7982 Long term (current) use of aspirin: Secondary | ICD-10-CM | POA: Diagnosis not present

## 2016-03-15 DIAGNOSIS — Z862 Personal history of diseases of the blood and blood-forming organs and certain disorders involving the immune mechanism: Secondary | ICD-10-CM | POA: Diagnosis not present

## 2016-03-15 DIAGNOSIS — K2950 Unspecified chronic gastritis without bleeding: Secondary | ICD-10-CM | POA: Insufficient documentation

## 2016-03-15 DIAGNOSIS — E039 Hypothyroidism, unspecified: Secondary | ICD-10-CM | POA: Diagnosis not present

## 2016-03-15 DIAGNOSIS — K2960 Other gastritis without bleeding: Secondary | ICD-10-CM | POA: Diagnosis not present

## 2016-03-15 DIAGNOSIS — K210 Gastro-esophageal reflux disease with esophagitis: Secondary | ICD-10-CM | POA: Diagnosis not present

## 2016-03-15 DIAGNOSIS — Z8673 Personal history of transient ischemic attack (TIA), and cerebral infarction without residual deficits: Secondary | ICD-10-CM | POA: Insufficient documentation

## 2016-03-15 DIAGNOSIS — K3189 Other diseases of stomach and duodenum: Secondary | ICD-10-CM | POA: Insufficient documentation

## 2016-03-15 DIAGNOSIS — Z79899 Other long term (current) drug therapy: Secondary | ICD-10-CM | POA: Diagnosis not present

## 2016-03-15 DIAGNOSIS — G4700 Insomnia, unspecified: Secondary | ICD-10-CM | POA: Diagnosis not present

## 2016-03-15 DIAGNOSIS — Z8719 Personal history of other diseases of the digestive system: Secondary | ICD-10-CM | POA: Diagnosis present

## 2016-03-15 DIAGNOSIS — K259 Gastric ulcer, unspecified as acute or chronic, without hemorrhage or perforation: Secondary | ICD-10-CM | POA: Diagnosis not present

## 2016-03-15 DIAGNOSIS — E7800 Pure hypercholesterolemia, unspecified: Secondary | ICD-10-CM | POA: Insufficient documentation

## 2016-03-15 DIAGNOSIS — K2900 Acute gastritis without bleeding: Secondary | ICD-10-CM | POA: Diagnosis not present

## 2016-03-15 DIAGNOSIS — R011 Cardiac murmur, unspecified: Secondary | ICD-10-CM | POA: Insufficient documentation

## 2016-03-15 DIAGNOSIS — F419 Anxiety disorder, unspecified: Secondary | ICD-10-CM | POA: Diagnosis not present

## 2016-03-15 DIAGNOSIS — K2970 Gastritis, unspecified, without bleeding: Secondary | ICD-10-CM | POA: Diagnosis not present

## 2016-03-15 DIAGNOSIS — F329 Major depressive disorder, single episode, unspecified: Secondary | ICD-10-CM | POA: Diagnosis not present

## 2016-03-15 HISTORY — PX: ESOPHAGOGASTRODUODENOSCOPY (EGD) WITH PROPOFOL: SHX5813

## 2016-03-15 SURGERY — ESOPHAGOGASTRODUODENOSCOPY (EGD) WITH PROPOFOL
Anesthesia: General

## 2016-03-15 MED ORDER — GLYCOPYRROLATE 0.2 MG/ML IJ SOLN
INTRAMUSCULAR | Status: DC | PRN
  Administered 2016-03-15: 0.2 mg via INTRAVENOUS

## 2016-03-15 MED ORDER — LIDOCAINE 2% (20 MG/ML) 5 ML SYRINGE
INTRAMUSCULAR | Status: DC | PRN
Start: 2016-03-15 — End: 2016-03-15
  Administered 2016-03-15: 50 mg via INTRAVENOUS

## 2016-03-15 MED ORDER — SODIUM CHLORIDE 0.9 % IV SOLN
INTRAVENOUS | Status: DC
  Administered 2016-03-15: 09:00:00 via INTRAVENOUS

## 2016-03-15 MED ORDER — SODIUM CHLORIDE 0.9 % IV SOLN
INTRAVENOUS | Status: DC
  Administered 2016-03-15: 11:00:00 via INTRAVENOUS

## 2016-03-15 MED ORDER — PROPOFOL 10 MG/ML IV BOLUS
INTRAVENOUS | Status: DC | PRN
  Administered 2016-03-15: 70 mg via INTRAVENOUS

## 2016-03-15 MED ORDER — MIDAZOLAM HCL 5 MG/5ML IJ SOLN
INTRAMUSCULAR | Status: DC | PRN
  Administered 2016-03-15: 1 mg via INTRAVENOUS

## 2016-03-15 MED ORDER — PROPOFOL 500 MG/50ML IV EMUL
INTRAVENOUS | Status: DC | PRN
  Administered 2016-03-15: 120 ug/kg/min via INTRAVENOUS

## 2016-03-15 NOTE — H&P (Signed)
Outpatient short stay form Pre-procedure 03/15/2016 10:49 AM Carolyn Deem MD  Primary Physician: Dr. Nilda Simmer  Reason for visit:  EGD  History of present illness:  Patient is a 65 year old female presenting today for EGD. She had an EGD on March 2017 at that time for dysphagia. This was shown not to be esophageal in nature. She was also found have gastric ulcers at that time. She is returning today for recheck. She has been taking pantoprazole 40 mg twice a day initially now once a day. She does take 81 mg aspirin. She takes no other aspirin or blood thinning agents.    Current Facility-Administered Medications:  .  0.9 %  sodium chloride infusion, , Intravenous, Continuous, Carolyn Deem, MD, Last Rate: 20 mL/hr at 03/15/16 0914 .  0.9 %  sodium chloride infusion, , Intravenous, Continuous, Carolyn Deem, MD  Prescriptions Prior to Admission  Medication Sig Dispense Refill Last Dose  . albuterol (PROVENTIL HFA;VENTOLIN HFA) 108 (90 Base) MCG/ACT inhaler Inhale 2 puffs into the lungs every 6 (six) hours as needed for wheezing or shortness of breath. 1 Inhaler 4 Past Week at Unknown time  . ALPRAZolam (XANAX) 0.5 MG tablet TAKE (1) TABLET BY MOUTH EVERY NIGHT AT BEDTIME AS NEEDED 30 tablet 5 03/14/2016 at 2200  . aspirin 81 MG tablet Take 81 mg by mouth daily.   03/14/2016 at 2200  . atorvastatin (LIPITOR) 10 MG tablet TAKE ONE (1) TABLET BY MOUTH ONCE DAILY 30 tablet 5 03/14/2016 at 2200  . Fiber TABS 500 mg. Take one daily.   03/14/2016 at 2200  . fish oil-omega-3 fatty acids 1000 MG capsule Take 2 g by mouth daily.   Past Week at Unknown time  . levothyroxine (SYNTHROID, LEVOTHROID) 112 MCG tablet Take 1 tablet (112 mcg total) by mouth daily. 30 tablet 5 03/14/2016 at 2200  . losartan (COZAAR) 25 MG tablet Take 1 tablet (25 mg total) by mouth daily. 30 tablet 5 03/14/2016 at 2200  . pyridostigmine (MESTINON) 60 MG tablet Take 60 mg by mouth 3 (three) times daily.     .  sertraline (ZOLOFT) 100 MG tablet 1 tablet daily 30 tablet 5 03/14/2016 at 2200  . traZODone (DESYREL) 50 MG tablet Take 1 tablet (50 mg total) by mouth at bedtime as needed. 30 tablet 5 03/14/2016 at 2200  . buPROPion (WELLBUTRIN XL) 300 MG 24 hr tablet Take 1 tablet (300 mg total) by mouth daily. 30 tablet 5 Taking  . triamcinolone cream (KENALOG) 0.1 % Apply topically 2 (two) times daily. 30 g 0 Taking     Allergies  Allergen Reactions  . Cephalexin Rash     Past Medical History:  Diagnosis Date  . Abdominal pain, unspecified site   . Anxiety state, unspecified   . Chest pain, unspecified   . Chest pain, unspecified   . Depressive disorder, not elsewhere classified   . Heart murmur    hx  . Hypertension   . Insomnia, unspecified   . Postmenopausal bleeding   . Pure hypercholesterolemia   . Shortness of breath dyspnea   . Unspecified hypothyroidism     Review of systems:      Physical Exam    Heart and lungs: Regular rate and rhythm without rub or gallop, lungs are bilaterally clear.    HEENT: Normocephalic atraumatic eyes are anicteric    Other:     Pertinant exam for procedure: Soft nontender nondistended bowel sounds positive normoactive.    Planned  proceedures: EGD and indicated procedures. I have discussed the risks benefits and complications of procedures to include not limited to bleeding, infection, perforation and the risk of sedation and the patient wishes to proceed.    Carolyn Deem, MD Gastroenterology 03/15/2016  10:49 AM

## 2016-03-15 NOTE — Transfer of Care (Signed)
Immediate Anesthesia Transfer of Care Note  Patient: Carolyn Campos  Procedure(s) Performed: Procedure(s): ESOPHAGOGASTRODUODENOSCOPY (EGD) WITH PROPOFOL (N/A)  Patient Location: Endoscopy Unit  Anesthesia Type:General  Level of Consciousness: awake  Airway & Oxygen Therapy: Patient Spontanous Breathing and Patient connected to nasal cannula oxygen  Post-op Assessment: Report given to RN and Post -op Vital signs reviewed and stable  Post vital signs: Reviewed  Last Vitals:  Vitals:   03/15/16 0852 03/15/16 1110  BP: 140/62 122/65  Pulse: (!) 56 61  Resp: 16 16  Temp: 36.1 C     Last Pain:  Vitals:   03/15/16 0852  TempSrc: Tympanic         Complications: No apparent anesthesia complications

## 2016-03-15 NOTE — Op Note (Signed)
[facility] Gastroenterology Patient Name: Carolyn Campos Procedure Date: 03/15/2016 10:40 AM MRN: [id number] Account #: [id number] Date of Birth: Jan 08, 1951 Admit Type: Outpatient Age: 65 Room: [facility] ENDO ROOM 1 Gender: Female Note Status: Finalized Procedure:            Upper GI endoscopy Indications:          Follow-up of gastric ulcer Providers:            Christena Deem, MD Referring MD:         Myrle Sheng. [name], MD (Referring MD) Medicines:            Monitored Anesthesia Care Complications:        No immediate complications. Procedure:            Pre-Anesthesia Assessment:                       - ASA Grade Assessment: III - A patient with severe                        systemic disease.                       After obtaining informed consent, the endoscope was                        passed under direct vision. Throughout the procedure,                        the patient's blood pressure, pulse, and oxygen                        saturations were monitored continuously. The Endoscope                        was introduced through the mouth, and advanced to the                        third part of duodenum. The upper GI endoscopy was                        accomplished without difficulty. The patient tolerated                        the procedure well. Findings:      The Z-line was irregular. Biopsies were taken with a cold forceps for       histology.      The exam of the esophagus was otherwise normal. There is no evidence of       stenosis or stricture or narrowing.      A single localized, 2 mm non-bleeding erosion was found in the       prepyloric region of the stomach. There were no stigmata of recent       bleeding. Biopsies were taken with a cold forceps for histology.       Biopsies were taken with a cold forceps for Helicobacter pylori testing.       Previiousoy noted ulcers are much improved.      The examined duodenum was normal. Impression:            - Z-line irregular. Biopsied.                       -  Non-bleeding erosive gastropathy. Biopsied.                       - Normal examined duodenum. Recommendation:       - Await pathology results.                       - Use Protonix (pantoprazole) 40 mg PO daily daily.                       - Use sucralfate suspension 1 gram PO BID daily. Procedure Code(s):    --- Professional ---                       [phone], Esophagogastroduodenoscopy, flexible, transoral;                        with biopsy, single or multiple Diagnosis Code(s):    --- Professional ---                       K22.8, Other specified diseases of esophagus                       K31.89, Other diseases of stomach and duodenum                       K25.9, Gastric ulcer, unspecified as acute or chronic,                        without hemorrhage or perforation CPT copyright 2016 American Medical Association. All rights reserved. The codes documented in this report are preliminary and upon coder review may  be revised to meet current compliance requirements. Christena Deem, MD 03/15/2016 11:10:14 AM This report has been signed electronically. Number of Addenda: 0 Note Initiated On: 03/15/2016 10:40 AM      [facility]

## 2016-03-15 NOTE — Anesthesia Preprocedure Evaluation (Signed)
Anesthesia Evaluation  Patient identified by MRN, date of birth, ID band Patient awake    Reviewed: Allergy & Precautions, NPO status , Patient's Chart, lab work & pertinent test results  Airway Mallampati: II       Dental  (+) Upper Dentures   Pulmonary shortness of breath and with exertion,     + decreased breath sounds      Cardiovascular hypertension, Pt. on medications  Rhythm:Regular     Neuro/Psych Anxiety Depression TIA   GI/Hepatic negative GI ROS, Neg liver ROS,   Endo/Other  Hypothyroidism Morbid obesity  Renal/GU negative Renal ROS     Musculoskeletal   Abdominal (+) + obese,   Peds  Hematology  (+) anemia ,   Anesthesia Other Findings   Reproductive/Obstetrics                             Anesthesia Physical Anesthesia Plan  ASA: II  Anesthesia Plan: General   Post-op Pain Management:    Induction: Intravenous  Airway Management Planned: Natural Airway and Nasal Cannula  Additional Equipment:   Intra-op Plan:   Post-operative Plan:   Informed Consent: I have reviewed the patients History and Physical, chart, labs and discussed the procedure including the risks, benefits and alternatives for the proposed anesthesia with the patient or authorized representative who has indicated his/her understanding and acceptance.     Plan Discussed with: CRNA  Anesthesia Plan Comments:         Anesthesia Quick Evaluation

## 2016-03-15 NOTE — Anesthesia Postprocedure Evaluation (Signed)
Anesthesia Post Note  Patient: Carolyn Campos  Procedure(s) Performed: Procedure(s) (LRB): ESOPHAGOGASTRODUODENOSCOPY (EGD) WITH PROPOFOL (N/A)  Patient location during evaluation: PACU Anesthesia Type: General Level of consciousness: awake Pain management: satisfactory to patient Vital Signs Assessment: post-procedure vital signs reviewed and stable Respiratory status: nonlabored ventilation Cardiovascular status: stable Anesthetic complications: no    Last Vitals:  Vitals:   03/15/16 1110 03/15/16 1130  BP: 122/65 131/71  Pulse: 61 60  Resp: 16 13  Temp: 36.1 C     Last Pain:  Vitals:   03/15/16 1110  TempSrc: Tympanic  PainSc: Asleep                 VAN STAVEREN,Cort Dragoo

## 2016-03-16 ENCOUNTER — Encounter: Payer: Self-pay | Admitting: Gastroenterology

## 2016-03-16 LAB — SURGICAL PATHOLOGY

## 2016-03-30 ENCOUNTER — Encounter: Payer: Self-pay | Admitting: Family Medicine

## 2016-03-30 ENCOUNTER — Ambulatory Visit (INDEPENDENT_AMBULATORY_CARE_PROVIDER_SITE_OTHER): Payer: Medicare Other | Admitting: Family Medicine

## 2016-03-30 VITALS — BP 122/70 | HR 64 | Temp 98.2°F | Resp 18 | Ht 63.0 in | Wt 217.0 lb

## 2016-03-30 DIAGNOSIS — E038 Other specified hypothyroidism: Secondary | ICD-10-CM

## 2016-03-30 DIAGNOSIS — Z0000 Encounter for general adult medical examination without abnormal findings: Secondary | ICD-10-CM | POA: Diagnosis not present

## 2016-03-30 DIAGNOSIS — E669 Obesity, unspecified: Secondary | ICD-10-CM

## 2016-03-30 DIAGNOSIS — F418 Other specified anxiety disorders: Secondary | ICD-10-CM

## 2016-03-30 DIAGNOSIS — F329 Major depressive disorder, single episode, unspecified: Secondary | ICD-10-CM

## 2016-03-30 DIAGNOSIS — E7800 Pure hypercholesterolemia, unspecified: Secondary | ICD-10-CM | POA: Diagnosis not present

## 2016-03-30 DIAGNOSIS — E034 Atrophy of thyroid (acquired): Secondary | ICD-10-CM | POA: Diagnosis not present

## 2016-03-30 DIAGNOSIS — I10 Essential (primary) hypertension: Secondary | ICD-10-CM | POA: Diagnosis not present

## 2016-03-30 DIAGNOSIS — G4700 Insomnia, unspecified: Secondary | ICD-10-CM | POA: Diagnosis not present

## 2016-03-30 DIAGNOSIS — D649 Anemia, unspecified: Secondary | ICD-10-CM

## 2016-03-30 DIAGNOSIS — F419 Anxiety disorder, unspecified: Secondary | ICD-10-CM

## 2016-03-30 DIAGNOSIS — R7309 Other abnormal glucose: Secondary | ICD-10-CM

## 2016-03-30 DIAGNOSIS — R1310 Dysphagia, unspecified: Secondary | ICD-10-CM | POA: Diagnosis not present

## 2016-03-30 DIAGNOSIS — R471 Dysarthria and anarthria: Secondary | ICD-10-CM | POA: Diagnosis not present

## 2016-03-30 LAB — COMPREHENSIVE METABOLIC PANEL
ALK PHOS: 69 U/L (ref 33–130)
ALT: 12 U/L (ref 6–29)
AST: 14 U/L (ref 10–35)
Albumin: 4.4 g/dL (ref 3.6–5.1)
BUN: 12 mg/dL (ref 7–25)
CALCIUM: 9.7 mg/dL (ref 8.6–10.4)
CO2: 26 mmol/L (ref 20–31)
Chloride: 107 mmol/L (ref 98–110)
Creat: 0.85 mg/dL (ref 0.50–0.99)
GLUCOSE: 101 mg/dL — AB (ref 65–99)
POTASSIUM: 3.8 mmol/L (ref 3.5–5.3)
Sodium: 142 mmol/L (ref 135–146)
Total Bilirubin: 0.6 mg/dL (ref 0.2–1.2)
Total Protein: 7 g/dL (ref 6.1–8.1)

## 2016-03-30 LAB — CBC WITH DIFFERENTIAL/PLATELET
BASOS ABS: 0 {cells}/uL (ref 0–200)
Basophils Relative: 0 %
EOS ABS: 94 {cells}/uL (ref 15–500)
Eosinophils Relative: 2 %
HEMATOCRIT: 36.8 % (ref 35.0–45.0)
Hemoglobin: 12.2 g/dL (ref 11.7–15.5)
LYMPHS ABS: 1880 {cells}/uL (ref 850–3900)
MCH: 31 pg (ref 27.0–33.0)
MCHC: 33.2 g/dL (ref 32.0–36.0)
MCV: 93.4 fL (ref 80.0–100.0)
MONO ABS: 376 {cells}/uL (ref 200–950)
MPV: 10 fL (ref 7.5–12.5)
Monocytes Relative: 8 %
NEUTROS PCT: 50 %
Neutro Abs: 2350 cells/uL (ref 1500–7800)
Platelets: 242 10*3/uL (ref 140–400)
RBC: 3.94 MIL/uL (ref 3.80–5.10)
RDW: 13.2 % (ref 11.0–15.0)
WBC: 4.7 10*3/uL (ref 3.8–10.8)

## 2016-03-30 LAB — CK: CK TOTAL: 109 U/L (ref 7–177)

## 2016-03-30 LAB — TSH: TSH: 0.15 mIU/L — ABNORMAL LOW

## 2016-03-30 LAB — LIPID PANEL
CHOL/HDL RATIO: 2.8 ratio (ref <=5.0)
CHOLESTEROL: 145 mg/dL (ref 125–200)
HDL: 51 mg/dL (ref >=46)
LDL Cholesterol: 77 mg/dL (ref <130)
Triglycerides: 85 mg/dL (ref <150)
VLDL: 17 mg/dL (ref <30)

## 2016-03-30 LAB — T4, FREE: Free T4: 1.1 ng/dL (ref 0.8–1.8)

## 2016-03-30 MED ORDER — TRAZODONE HCL 50 MG PO TABS: 50.0000 mg | ORAL_TABLET | Freq: Every evening | ORAL | 5 refills | Status: DC | PRN

## 2016-03-30 MED ORDER — LEVOTHYROXINE SODIUM 112 MCG PO TABS: 112.0000 ug | ORAL_TABLET | Freq: Every day | ORAL | 5 refills | Status: DC

## 2016-03-30 MED ORDER — SERTRALINE HCL 100 MG PO TABS: ORAL_TABLET | ORAL | 5 refills | Status: DC

## 2016-03-30 MED ORDER — ALPRAZOLAM 0.5 MG PO TABS: ORAL_TABLET | ORAL | 5 refills | Status: DC

## 2016-03-30 MED ORDER — LOSARTAN POTASSIUM 25 MG PO TABS: 25.0000 mg | ORAL_TABLET | Freq: Every day | ORAL | 5 refills | Status: DC

## 2016-03-30 MED ORDER — ATORVASTATIN CALCIUM 20 MG PO TABS: ORAL_TABLET | ORAL | 5 refills | Status: DC

## 2016-03-30 NOTE — Patient Instructions (Addendum)
   IF you received an x-ray today, you will receive an invoice from [facility]. Please contact [facility] at [phone] with questions or concerns regarding your invoice.   IF you received labwork today, you will receive an invoice from [facility]. Please contact [facility] at [phone] with questions or concerns regarding your invoice.   Our billing staff will not be able to assist you with questions regarding bills from these companies.  You will be contacted with the lab results as soon as they are available. The fastest way to get your results is to activate your My Chart account. Instructions are located on the last page of this paperwork. If you have not heard from us regarding the results in 2 weeks, please contact this office.    Keeping You Healthy  Get These Tests  Blood Pressure- Have your blood pressure checked by your healthcare provider at least once a year.  Normal blood pressure is 120/80.  Weight- Have your body mass index (BMI) calculated to screen for obesity.  BMI is a measure of body fat based on height and weight.  You can calculate your own BMI at www.nhlbisupport.com/bmi/  Cholesterol- Have your cholesterol checked every year.  Diabetes- Have your blood sugar checked every year if you have high blood pressure, high cholesterol, a family history of diabetes or if you are overweight.  Pap Test - Have a pap test every 1 to 5 years if you have been sexually active.  If you are older than 65 and recent pap tests have been normal you may not need additional pap tests.  In addition, if you have had a hysterectomy  for benign disease additional pap tests are not necessary.  Mammogram-Yearly mammograms are essential for early detection of breast cancer  Screening for Colon Cancer- Colonoscopy starting at age 50. Screening may begin sooner depending on your family history and other health conditions.  Follow up colonoscopy  as directed by your Gastroenterologist.  Screening for Osteoporosis- Screening begins at age 65 with bone density scanning, sooner if you are at higher risk for developing Osteoporosis.  Get these medicines  Calcium with Vitamin D- Your body requires 1200-1500 mg of Calcium a day and 800-1000 IU of Vitamin D a day.  You can only absorb 500 mg of Calcium at a time therefore Calcium must be taken in 2 or 3 separate doses throughout the day.  Hormones- Hormone therapy has been associated with increased risk for certain cancers and heart disease.  Talk to your healthcare provider about if you need relief from menopausal symptoms.  Aspirin- Ask your healthcare provider about taking Aspirin to prevent Heart Disease and Stroke.  Get these Immuniztions  Flu shot- Every fall  Pneumonia shot- Once after the age of 65; if you are younger ask your healthcare provider if you need a pneumonia shot.  Tetanus- Every ten years.  Zostavax- Once after the age of 60 to prevent shingles.  Take these steps  Don't smoke- Your healthcare provider can help you quit. For tips on how to quit, ask your healthcare provider or go to www.smokefree.gov or call 1-800 QUIT-NOW.  Be physically active- Exercise 5 days a week for a minimum of 30 minutes.  If you are not already physically active, start slow and gradually work up to 30 minutes of moderate physical activity.  Try walking, dancing, bike riding, swimming, etc.  Eat a healthy diet- Eat a variety of healthy foods such as fruits, vegetables, whole   grains, low fat milk, low fat cheeses, yogurt, lean meats, chicken, fish, eggs, dried beans, tofu, etc.  For more information go to www.thenutritionsource.org  Dental visit- Brush and floss teeth twice daily; visit your dentist twice a year.  Eye exam- Visit your Optometrist or Ophthalmologist yearly.  Drink alcohol in moderation- Limit alcohol intake to one drink or less a day.  Never drink and  drive.  Depression- Your emotional health is as important as your physical health.  If you're feeling down or losing interest in things you normally enjoy, please talk to your healthcare provider.  Seat Belts- can save your life; always wear one  Smoke/Carbon Monoxide detectors- These detectors need to be installed on the appropriate level of your home.  Replace batteries at least once a year.  Violence- If anyone is threatening or hurting you, please tell your healthcare provider.  Living Will/ Health care power of attorney- Discuss with your healthcare provider and family. 

## 2016-03-30 NOTE — Progress Notes (Signed)
Subjective:    Patient ID: Carolyn PontWanda S Tranchina, female    DOB: 07-31-1951, 65 y.o.   MRN: [id number]  By signing my name below, I, Javier Dockerobert Ryan Halas, attest that this documentation has been prepared under the direction and in the presence of Oswaldo DoneKristy Ryley Bachtel, MD. Electronically Signed: Javier Dockerobert Ryan Halas, ER Scribe. 03/27/2016. 4:20 PM.  03/30/2016  Annual Exam  HPI HPI Comments: Carolyn Campos is a 65 y.o. female who [facility]. She has a past hx of HTN, hypothyroidism, anxiety and depression as well as a recent history of distant small stroke. She has been seeing neurology, Dr. Cle[place]Shaw in [place], for aphasia/slurred speech/dysphagia and facial twitching. Her facial twitching has been ongoing for several years. She had an MRI of the brain which showed moderate white matter changes. Dr. Clelia CroftShaw thought her sx were related to a small remote stroke, and he recommended aspirin and statins. She started having issues with swallowing that started in February. Shortly after that she started having difficulty speaking, especially when she is tired. Her speaking issues were initially only a few times per week, but have become constant. She states she is still stressed all the time. Her father died in January. She has lost 32 lbs which improved her breathing. She was prescribed an inhaler by a lung doctor, but she has not used it since she lost weight. She is still taking xanax regularly because she gets mad at herself when she cannot do things that she used to be able to. She has been dismissed/medically cleared by the heart doctor. She denies SI though she is still having anxiety. She is not taking wellbutrin. Her last pap smear was in 2013.   Her hearing has been normal. She denies HA, dizziness, blurred vision, double vision, chest pain, palpitations, leg swelling, joint pain. She has occational cough, but does not have SOB. Her neurologist told her it was fine for her to drive. She  is [facility] a [facility] in September [facility]. She walks with her dog 1/2 hour per day.   Mammogram was in February. Colonoscopy was in 2011. Tetanus was 2009. Flu shot in September, 2016. She has not had a pneumonia shot.   She saw Dr. Vilinda BlanksSkulsky and received an endoscopy in February when her swallowing sx started.   Review of Systems  Constitutional: Negative for activity change, appetite change, chills, diaphoresis, fatigue, fever and unexpected weight change.  HENT: Negative for congestion, dental problem, drooling, ear discharge, ear pain, facial swelling, hearing loss, mouth sores, nosebleeds, postnasal drip, rhinorrhea, sinus pressure, sneezing, sore throat, tinnitus, trouble swallowing and voice change.   Eyes: Negative for photophobia, pain, discharge, redness, itching and visual disturbance.  Respiratory: Negative for apnea, cough, choking, chest tightness, shortness of breath, wheezing and stridor.   Cardiovascular: Negative for chest pain, palpitations and leg swelling.  Gastrointestinal: Negative for abdominal distention, abdominal pain, anal bleeding, blood in stool, constipation, diarrhea, nausea, rectal pain and vomiting.  Endocrine: Negative for cold intolerance, heat intolerance, polydipsia, polyphagia and polyuria.  Genitourinary: Negative for decreased urine volume, difficulty urinating, dyspareunia, dysuria, enuresis, flank pain, frequency, genital sores, hematuria, menstrual problem, pelvic pain, urgency, vaginal bleeding, vaginal discharge and vaginal pain.  Musculoskeletal: Negative for arthralgias, back pain, gait problem, joint swelling, myalgias, neck pain and neck stiffness.  Skin: Negative for color change, pallor, rash and wound.  Allergic/Immunologic: Negative for environmental allergies, food allergies and immunocompromised state.  Neurological: Positive for dizziness  and speech difficulty. Negative for tremors, seizures, syncope, facial  asymmetry, weakness, light-headedness, numbness and headaches.  Hematological: Negative for adenopathy. Does not bruise/bleed easily.  Psychiatric/Behavioral: Positive for confusion. Negative for agitation, behavioral problems, decreased concentration, dysphoric mood, hallucinations, self-injury, sleep disturbance and suicidal ideas. The patient is nervous/anxious. The patient is not hyperactive.     Past Medical History:  Diagnosis Date  . Abdominal pain, unspecified site   . Anxiety state, unspecified   . Chest pain, unspecified   . Chest pain, unspecified   . Depressive disorder, not elsewhere classified   . Heart murmur    hx  . Hypertension   . Insomnia, unspecified   . Postmenopausal bleeding   . Pure hypercholesterolemia   . Shortness of breath dyspnea   . Unspecified hypothyroidism    Past Surgical History:  Procedure Laterality Date  . ABDOMINAL HYSTERECTOMY  04/16/2012   one ovary resected.  No malignancy.  Post-menopausal bleeding.  [facility].  Marland Kitchen arm surgery     x 4 post accident at work, with skin graft, [facility]  . CESAREAN SECTION     x 2  . COLONOSCOPY  08/16/2009   normal.  [facility].  Repeat in 10 years.  Marland Kitchen DILATION AND CURETTAGE OF UTERUS    . ESOPHAGOGASTRODUODENOSCOPY (EGD) WITH PROPOFOL N/A 12/01/2015   Procedure: ESOPHAGOGASTRODUODENOSCOPY (EGD) WITH PROPOFOL;  Surgeon: Christena Deem, MD;  Location[facility] ENDOSCOPY;  Service: Endoscopy;  Laterality: N/A;  . ESOPHAGOGASTRODUODENOSCOPY (EGD) WITH PROPOFOL N/A 03/15/2016   Procedure: ESOPHAGOGASTRODUODENOSCOPY (EGD) WITH PROPOFOL;  Surgeon: Christena Deem, MD;  Location: [facility] ENDOSCOPY;  Service: Endoscopy;  Laterality: N/A;   Allergies  Allergen Reactions  . Cephalexin Rash    Social History   Social History  . Marital status: Married    Spouse name: N/A  . Number of children: 2  . Years of education: 12   Occupational History  . disabled     due to arm surgery   Social History Main  Topics  . Smoking status: Never Smoker  . Smokeless tobacco: Never Used  . Alcohol use No  . Drug use: No  . Sexual activity: Not on file   Other Topics Concern  . Not on file   Social History Narrative   Sexual activity:  Not sexually active.       Seatbelt:  Always uses seat belts.       Smoke alarm and carbon monoxide detector in the home.       Guns: Has no unsecured guns in the home.       Caffeine:  Does not consume any caffeine.       Exercise: walks daily for 30 minutes walks 1-2 miles about every other day. Has dog.       Marital status:  Married x 41 years; moderately happy; married; no abuse;       Children:  2 daughters, 3 grandchildren/sons.       Organ donor: Yes.       Living Will: Patient does not have living will, DOES have HCPOA.      Employment: disability 2010 for [employer] comp injury arm.   Family History  Problem Relation Age of Onset  . Heart disease Brother     from drug abuse  . Alcohol abuse Brother   . Cirrhosis Brother   . COPD Mother   . Heart disease Mother     CHF  . Heart failure Mother   . Pulmonary fibrosis Mother  died in 2009  . Cancer Father     lung, prostate  . Hyperlipidemia Father   . Heart disease Father 29    CABG  . Ulcers Brother   . Breast cancer Paternal Aunt 107       Objective:    BP 122/70 (BP Location: Right Arm, Patient Position: Sitting, Cuff Size: Large)   Pulse 64   Temp 98.2 F (36.8 C) (Oral)   Resp 18   Ht 5\' 3"  (1.6 m)   Wt 217 lb (98.4 kg)   SpO2 98%   BMI 38.44 kg/m  Physical Exam  Constitutional: She is oriented to person, place, and time. She appears well-developed and well-nourished. No distress.  HENT:  Head: Normocephalic and atraumatic.  Right Ear: External ear normal.  Left Ear: External ear normal.  Nose: Nose normal.  Mouth/Throat: Oropharynx is clear and moist.  Eyes: Conjunctivae and EOM are normal. Pupils are equal, round, and reactive to light.  Neck: Normal range of motion  and full passive range of motion without pain. Neck supple. No JVD present. Carotid bruit is not present. No thyromegaly present.  Cardiovascular: Normal rate, regular rhythm and normal heart sounds.  Exam reveals no gallop and no friction rub.   No murmur heard. Pulmonary/Chest: Effort normal and breath sounds normal. No respiratory distress. She has no wheezes. She has no rales. Right breast exhibits no inverted nipple, no mass, no nipple discharge, no skin change and no tenderness. Left breast exhibits no inverted nipple, no mass, no nipple discharge, no skin change and no tenderness. Breasts are symmetrical.  Abdominal: Soft. Bowel sounds are normal. She exhibits no distension and no mass. There is no tenderness. There is no rebound and no guarding.  Musculoskeletal: Normal range of motion.       Right shoulder: Normal.       Left shoulder: Normal.       Cervical back: Normal.  Lymphadenopathy:    She has no cervical adenopathy.  Neurological: She is alert and oriented to person, place, and time. She has normal reflexes. She displays no tremor. No cranial nerve deficit or sensory deficit. She exhibits normal muscle tone. Coordination normal.  Motor strength 5/5.  +slurred speech and speech is signficantly slowed from baseline; this is a profound change from last visit in 08/2015; +facial twitch also noticed during visit today; intermittent in nature.      Skin: Skin is warm and dry. No rash noted. She is not diaphoretic. No erythema. No pallor.  Psychiatric: She has a normal mood and affect. Her behavior is normal. Judgment and thought content normal.  Nursing note and vitals reviewed.  Depression screen [facility] 2/9 09/15/2015 06/16/2015 11/11/2014  Decreased Interest 1 3 1   Down, Depressed, Hopeless 3 2 1   PHQ - 2 Score 4 5 2   Altered sleeping 0 1 0  Tired, decreased energy 3 3 1   Change in appetite 3 3 1   Feeling bad or failure about yourself  3 3 1   Trouble concentrating 2 1 1   Moving  slowly or fidgety/restless 2 0 0  Suicidal thoughts 2 1 0  PHQ-9 Score 19 17 6    Fall Risk  09/15/2015 06/16/2015 11/11/2014  Falls in the past year? No No No       Assessment & Plan:   1. Encounter for Medicare annual wellness exam   2. Essential hypertension   3. Hypothyroidism due to acquired atrophy of thyroid   4. Anxiety and depression  5. Insomnia   6. Other abnormal glucose   7. Pure hypercholesterolemia   8. Obesity (BMI 30-39.9)   9. Anemia, unspecified   10. Dysphagia   11. Dysarthria    -anticipatory guidance. -obtain labs; refills provided. -plan to wean off of Xanax over the upcoming two visits with current neurological processes evolving. -patient with profound speech changes since last visit in our office 08/2015; undergoing evaluation by neurology/Shah; s/p MRI; very concerning for neuromuscular process.   Orders Placed This Encounter  Procedures  . CBC with Differential/Platelet  . Comprehensive metabolic panel    Order Specific Question:   Has the patient fasted?    Answer:   Yes  . Lipid panel    Order Specific Question:   Has the patient fasted?    Answer:   Yes  . Hemoglobin A1c  . TSH  . T4, free  . CK  . EKG 12-Lead   Meds ordered this encounter  Medications  . traZODone (DESYREL) 50 MG tablet    Sig: Take 1 tablet (50 mg total) by mouth at bedtime as needed.    Dispense:  30 tablet    Refill:  5  . sertraline (ZOLOFT) 100 MG tablet    Sig: 1 tablet daily    Dispense:  30 tablet    Refill:  5  . losartan (COZAAR) 25 MG tablet    Sig: Take 1 tablet (25 mg total) by mouth daily.    Dispense:  30 tablet    Refill:  5  . levothyroxine (SYNTHROID, LEVOTHROID) 112 MCG tablet    Sig: Take 1 tablet (112 mcg total) by mouth daily.    Dispense:  30 tablet    Refill:  5  . atorvastatin (LIPITOR) 20 MG tablet    Sig: TAKE ONE (1) TABLET BY MOUTH ONCE DAILY    Dispense:  30 tablet    Refill:  5  . ALPRAZolam (XANAX) 0.5 MG tablet    Sig: TAKE  (1) TABLET BY MOUTH EVERY NIGHT AT BEDTIME AS NEEDED    Dispense:  30 tablet    Refill:  5    Return in about 6 months (around 09/30/2016) for recheck high blood pressure, anxiety/depression, high cholesterol.   I personally performed the services described in this documentation, which was scribed in my presence. The recorded information has been reviewed and considered.  Ellieana Dolecki Paulita FujitaMartin Di Jasmer, M.D. [facility]  [place] [address] [place], [place]  [id number] [phone] phone [phone] fax

## 2016-03-31 LAB — HEMOGLOBIN A1C
HEMOGLOBIN A1C: 5.8 % — AB (ref <5.7)
Mean Plasma Glucose: 120 mg/dL

## 2016-04-12 DIAGNOSIS — R471 Dysarthria and anarthria: Secondary | ICD-10-CM | POA: Insufficient documentation

## 2016-04-12 DIAGNOSIS — R1310 Dysphagia, unspecified: Secondary | ICD-10-CM | POA: Insufficient documentation

## 2016-04-15 DIAGNOSIS — K259 Gastric ulcer, unspecified as acute or chronic, without hemorrhage or perforation: Secondary | ICD-10-CM | POA: Diagnosis not present

## 2016-04-15 DIAGNOSIS — K219 Gastro-esophageal reflux disease without esophagitis: Secondary | ICD-10-CM | POA: Diagnosis not present

## 2016-04-15 DIAGNOSIS — R1310 Dysphagia, unspecified: Secondary | ICD-10-CM | POA: Diagnosis not present

## 2016-05-13 DIAGNOSIS — R0602 Shortness of breath: Secondary | ICD-10-CM | POA: Diagnosis not present

## 2016-05-13 DIAGNOSIS — G589 Mononeuropathy, unspecified: Secondary | ICD-10-CM | POA: Diagnosis not present

## 2016-05-13 DIAGNOSIS — R471 Dysarthria and anarthria: Secondary | ICD-10-CM | POA: Diagnosis not present

## 2016-05-13 DIAGNOSIS — R1319 Other dysphagia: Secondary | ICD-10-CM | POA: Diagnosis not present

## 2016-05-18 DIAGNOSIS — R092 Respiratory arrest: Secondary | ICD-10-CM | POA: Diagnosis not present

## 2016-05-18 DIAGNOSIS — E785 Hyperlipidemia, unspecified: Secondary | ICD-10-CM | POA: Diagnosis not present

## 2016-05-18 DIAGNOSIS — I10 Essential (primary) hypertension: Secondary | ICD-10-CM | POA: Diagnosis not present

## 2016-05-18 DIAGNOSIS — R4182 Altered mental status, unspecified: Secondary | ICD-10-CM | POA: Diagnosis not present

## 2016-05-18 DIAGNOSIS — Z8249 Family history of ischemic heart disease and other diseases of the circulatory system: Secondary | ICD-10-CM | POA: Diagnosis not present

## 2016-05-18 DIAGNOSIS — R498 Other voice and resonance disorders: Secondary | ICD-10-CM | POA: Diagnosis not present

## 2016-05-18 DIAGNOSIS — Z8673 Personal history of transient ischemic attack (TIA), and cerebral infarction without residual deficits: Secondary | ICD-10-CM | POA: Diagnosis not present

## 2016-05-18 DIAGNOSIS — F419 Anxiety disorder, unspecified: Secondary | ICD-10-CM | POA: Diagnosis not present

## 2016-05-18 DIAGNOSIS — Z79899 Other long term (current) drug therapy: Secondary | ICD-10-CM | POA: Diagnosis not present

## 2016-05-18 DIAGNOSIS — F329 Major depressive disorder, single episode, unspecified: Secondary | ICD-10-CM | POA: Diagnosis not present

## 2016-05-18 DIAGNOSIS — T17928A Food in respiratory tract, part unspecified causing other injury, initial encounter: Secondary | ICD-10-CM | POA: Diagnosis not present

## 2016-05-18 DIAGNOSIS — Z7982 Long term (current) use of aspirin: Secondary | ICD-10-CM | POA: Diagnosis not present

## 2016-05-18 DIAGNOSIS — E039 Hypothyroidism, unspecified: Secondary | ICD-10-CM | POA: Diagnosis not present

## 2016-05-18 DIAGNOSIS — R0602 Shortness of breath: Secondary | ICD-10-CM | POA: Diagnosis not present

## 2016-05-18 DIAGNOSIS — R4702 Dysphasia: Secondary | ICD-10-CM | POA: Diagnosis not present

## 2016-05-18 DIAGNOSIS — R471 Dysarthria and anarthria: Secondary | ICD-10-CM | POA: Diagnosis not present

## 2016-05-18 DIAGNOSIS — R1310 Dysphagia, unspecified: Secondary | ICD-10-CM | POA: Diagnosis not present

## 2016-05-19 DIAGNOSIS — E039 Hypothyroidism, unspecified: Secondary | ICD-10-CM | POA: Diagnosis not present

## 2016-05-19 DIAGNOSIS — R092 Respiratory arrest: Secondary | ICD-10-CM | POA: Diagnosis not present

## 2016-05-19 DIAGNOSIS — R1310 Dysphagia, unspecified: Secondary | ICD-10-CM | POA: Diagnosis not present

## 2016-05-20 DIAGNOSIS — R1319 Other dysphagia: Secondary | ICD-10-CM | POA: Diagnosis not present

## 2016-05-20 DIAGNOSIS — R1312 Dysphagia, oropharyngeal phase: Secondary | ICD-10-CM | POA: Diagnosis not present

## 2016-05-24 DIAGNOSIS — Z23 Encounter for immunization: Secondary | ICD-10-CM | POA: Diagnosis not present

## 2016-06-01 ENCOUNTER — Encounter: Payer: Self-pay | Admitting: Family Medicine

## 2016-06-01 ENCOUNTER — Ambulatory Visit (INDEPENDENT_AMBULATORY_CARE_PROVIDER_SITE_OTHER): Payer: Medicare Other | Admitting: Family Medicine

## 2016-06-01 VITALS — BP 136/64 | HR 69 | Temp 97.8°F | Resp 16 | Wt 213.0 lb

## 2016-06-01 DIAGNOSIS — R471 Dysarthria and anarthria: Secondary | ICD-10-CM | POA: Diagnosis not present

## 2016-06-01 DIAGNOSIS — R1312 Dysphagia, oropharyngeal phase: Secondary | ICD-10-CM

## 2016-06-01 DIAGNOSIS — R0609 Other forms of dyspnea: Secondary | ICD-10-CM

## 2016-06-01 MED ORDER — FOLIC ACID 1 MG PO TABS: 1.0000 mg | ORAL_TABLET | Freq: Every day | ORAL | 11 refills | Status: DC

## 2016-06-01 MED ORDER — VITAMIN B-12 1000 MCG PO TABS: 1000.0000 ug | ORAL_TABLET | Freq: Every day | ORAL | 11 refills | Status: AC

## 2016-06-01 NOTE — Patient Instructions (Signed)
     IF you received an x-ray today, you will receive an invoice from [facility]. Please contact [facility] at [phone] with questions or concerns regarding your invoice.   IF you received labwork today, you will receive an invoice from [facility]. Please contact [facility] at [phone] with questions or concerns regarding your invoice.   Our billing staff will not be able to assist you with questions regarding bills from these companies.  You will be contacted with the lab results as soon as they are available. The fastest way to get your results is to activate your My Chart account. Instructions are located on the last page of this paperwork. If you have not heard from us regarding the results in 2 weeks, please contact this office.      

## 2016-06-01 NOTE — Progress Notes (Signed)
Subjective:    Patient ID: Carolyn Campos, female    DOB: Jan 01, 1951, 65 y.o.   MRN: [id number]  06/01/2016  Hospitalization Follow-up   HPI This 65 y.o. female presents for evaluation of HOSPITAL/ED FOLLOW-UP for choking episode.  Got roast stuck in throat. Syncopal event with choking.  Husband performed CPR; [facility].  S/p ED visit; s/p EKG, CT brain.  Admitted overnight; performed blood work.  Decreased Synthroid from 112 to 100mcg.  October 5; s/p modified barium swallow.  Does not know diagnosis; must eat soft things.  Can never eat steak or roast again.  Gets choke on simple things; when drinking must perform chin tuck.  S/p neurology consultation 05/13/16; [place] to [place] in [facility].   Neurologist at [facility] did not feel that patient had suffered a CVA.  Referred to [facility].  To called next week with specialist name and appointment.  At [facility] neurology, recommended the following:  B12 low normal; folate low.  Consider MMA and CBC, copper. CBC WNL. TSH mildly low; T4 normal.   Wt Readings from Last 3 Encounters:  06/01/16 213 lb (96.6 kg)  03/30/16 217 lb (98.4 kg)  03/15/16 225 lb (102.1 kg)    BP Readings from Last 3 Encounters:  06/01/16 136/64  03/30/16 122/70  03/15/16 130/72    Review of Systems  Constitutional: Negative for chills, diaphoresis, fatigue and fever.  HENT: Positive for trouble swallowing and voice change.   Eyes: Negative for visual disturbance.  Respiratory: Negative for cough and shortness of breath.   Cardiovascular: Negative for chest pain, palpitations and leg swelling.  Gastrointestinal: Negative for abdominal pain, constipation, diarrhea, nausea and vomiting.  Endocrine: Negative for cold intolerance, heat intolerance, polydipsia, polyphagia and polyuria.  Neurological: Negative for dizziness, tremors, seizures, syncope, facial asymmetry, speech difficulty, weakness, light-headedness, numbness and headaches.    Psychiatric/Behavioral: Negative for dysphoric mood, self-injury, sleep disturbance and suicidal ideas. The patient is not nervous/anxious.     Past Medical History:  Diagnosis Date  . Abdominal pain, unspecified site   . Anxiety state, unspecified   . Chest pain, unspecified   . Chest pain, unspecified   . Depressive disorder, not elsewhere classified   . Heart murmur    hx  . Hypertension   . Insomnia, unspecified   . Postmenopausal bleeding   . Pure hypercholesterolemia   . Shortness of breath dyspnea   . Unspecified hypothyroidism    Past Surgical History:  Procedure Laterality Date  . ABDOMINAL HYSTERECTOMY  04/16/2012   one ovary resected.  No malignancy.  Post-menopausal bleeding.  [facility].  Marland Kitchen. arm surgery     x 4 post accident at work, [facility]  . CESAREAN SECTION     x 2  . COLONOSCOPY  08/16/2009   normal.  [facility].  Repeat in 10 years.  Marland Kitchen. DILATION AND CURETTAGE OF UTERUS    . ESOPHAGOGASTRODUODENOSCOPY (EGD) WITH PROPOFOL N/A 12/01/2015   Procedure: ESOPHAGOGASTRODUODENOSCOPY (EGD) WITH PROPOFOL;  Surgeon: Christena DeemMartin U Skulskie, MD;  [facility];  Service: Endoscopy;  Laterality: N/A;  . ESOPHAGOGASTRODUODENOSCOPY (EGD) WITH PROPOFOL N/A 03/15/2016   Procedure: ESOPHAGOGASTRODUODENOSCOPY (EGD) WITH PROPOFOL;  Surgeon: Christena DeemMartin U Skulskie, MD;  Location[facility] END[facility]COPY;  Service: Endoscopy;  Laterality: N/A;   Allergies  Allergen Reactions  . Cephalexin Rash   Current Outpatient Prescriptions  Medication Sig Dispense Refill  . albuterol (PROVENTIL HFA;VENTOLIN HFA) 108 (90 Base) MCG/ACT inhaler Inhale 2 puffs into the lungs  every 6 (six) hours as needed for wheezing or shortness of breath. 1 Inhaler 4  . ALPRAZolam (XANAX) 0.5 MG tablet TAKE (1) TABLET BY MOUTH EVERY NIGHT AT BEDTIME AS NEEDED 30 tablet 5  . aspirin 81 MG tablet Take 81 mg by mouth daily.    Marland Kitchen atorvastatin (LIPITOR) 20 MG tablet TAKE ONE (1) TABLET BY MOUTH ONCE  DAILY 30 tablet 5  . Fiber TABS 500 mg. Take one daily.    . fish oil-omega-3 fatty acids 1000 MG capsule Take 2 g by mouth daily.    Marland Kitchen levothyroxine (SYNTHROID, LEVOTHROID) 112 MCG tablet Take 1 tablet (112 mcg total) by mouth daily. 30 tablet 5  . losartan (COZAAR) 25 MG tablet Take 1 tablet (25 mg total) by mouth daily. 30 tablet 5  . pyridostigmine (MESTINON) 60 MG tablet Take 60 mg by mouth 3 (three) times daily.    . sertraline (ZOLOFT) 100 MG tablet 1 tablet daily 30 tablet 5  . traZODone (DESYREL) 50 MG tablet Take 1 tablet (50 mg total) by mouth at bedtime as needed. 30 tablet 5  . triamcinolone cream (KENALOG) 0.1 % Apply topically 2 (two) times daily. 30 g 0  . buPROPion (WELLBUTRIN XL) 300 MG 24 hr tablet Take 1 tablet (300 mg total) by mouth daily. (Patient not taking: Reported on 06/01/2016) 30 tablet 5  . folic acid (FOLVITE) 1 MG tablet Take 1 tablet (1 mg total) by mouth daily. 30 tablet 11  . vitamin B-12 (CYANOCOBALAMIN) 1000 MCG tablet Take 1 tablet (1,000 mcg total) by mouth daily. 30 tablet 11   No current facility-administered medications for this visit.    Social History   Social History  . Marital status: Married    Spouse name: N/A  . Number of children: 2  . Years of education: 12   Occupational History  . disabled     due to arm surgery   Social History Main Topics  . Smoking status: Never Smoker  . Smokeless tobacco: Never Used  . Alcohol use No  . Drug use: No  . Sexual activity: Not on file   Other Topics Concern  . Not on file   Social History Narrative   Sexual activity:  Not sexually active.       Seatbelt:  Always uses seat belts.       Smoke alarm and carbon monoxide detector in the home.       Guns: Has no unsecured guns in the home.       Caffeine:  Does not consume any caffeine.       Exercise: walks daily for 30 minutes walks 1-2 miles about every other day. Has dog.       Marital status:  Married x 41 years; moderately happy;  married; no abuse;       Children:  2 daughters, 3 grandchildren/sons.       Organ donor: Yes.       Living Will: Patient does not have living will, DOES have HCPOA.      Employment: disability 21-Jan-2009 for [employer] comp injury arm.   Family History  Problem Relation Age of Onset  . Heart disease Brother     from drug abuse  . Alcohol abuse Brother   . Cirrhosis Brother   . COPD Mother   . Heart disease Mother     CHF  . Heart failure Mother   . Pulmonary fibrosis Mother     died in 01-22-2008  .  Cancer Father     lung, prostate  . Hyperlipidemia Father   . Heart disease Father 42    CABG  . Ulcers Brother   . Breast cancer Paternal Aunt 72       Objective:    BP 136/64 (BP Location: Right Arm, Patient Position: Sitting, Cuff Size: Large)   Pulse 69   Temp 97.8 F (36.6 C)   Resp 16   Wt 213 lb (96.6 kg)   SpO2 98%   BMI 37.73 kg/m  Physical Exam  Constitutional: She is oriented to person, place, and time. She appears well-developed and well-nourished. No distress.  HENT:  Head: Normocephalic and atraumatic.  Right Ear: External ear normal.  Left Ear: External ear normal.  Nose: Nose normal.  Mouth/Throat: Oropharynx is clear and moist.  Eyes: Conjunctivae and EOM are normal. Pupils are equal, round, and reactive to light.  Neck: Normal range of motion. Neck supple. Carotid bruit is not present. No thyromegaly present.  Cardiovascular: Normal rate, regular rhythm, normal heart sounds and intact distal pulses.  Exam reveals no gallop and no friction rub.   No murmur heard. Pulmonary/Chest: Effort normal and breath sounds normal. She has no wheezes. She has no rales.  Abdominal: Soft. Bowel sounds are normal. She exhibits no distension and no mass. There is no tenderness. There is no rebound and no guarding.  Lymphadenopathy:    She has no cervical adenopathy.  Neurological: She is alert and oriented to person, place, and time. No cranial nerve deficit. She exhibits normal  muscle tone. Coordination normal.  Profound slurred speech in comparison to one year ago.  Skin: Skin is warm and dry. No rash noted. She is not diaphoretic. No erythema. No pallor.  Psychiatric: She has a normal mood and affect. Her behavior is normal. Judgment and thought content normal.        Assessment & Plan:   1. Oropharyngeal dysphagia   2. Dyspnea on exertion   3. Dysarthria    -progressively worsening.  S/p neurology consultation with Marga Hoots and [facility]; to be referred to a specialist at [facility]. -obtain labs recommended by neurology at [facility]. -s/p swallowing study. -refer back to pulmonology to repeat pulmonary function tests per recommendations of [facility].   -recent folate and Vitamin B12 levels low; rx provided for both supplements. -patient has been complaining of DOE for the past two years; s/p cardiology and pulmonology consultations in the past two years; refer back to pulmonology for repeat spirometry and studies for comparison with progressive symptoms.   Orders Placed This Encounter  Procedures  . Copper, Serum  . Ceruloplasmin  . Methylmalonic Acid, Serum  . Ambulatory referral to Pulmonology    Referral Priority:   Routine    Referral Type:   Consultation    Referral Reason:   Specialty Services Required    Requested Specialty:   Pulmonary Disease    Number of Visits Requested:   1   Meds ordered this encounter  Medications  . folic acid (FOLVITE) 1 MG tablet    Sig: Take 1 tablet (1 mg total) by mouth daily.    Dispense:  30 tablet    Refill:  11  . vitamin B-12 (CYANOCOBALAMIN) 1000 MCG tablet    Sig: Take 1 tablet (1,000 mcg total) by mouth daily.    Dispense:  30 tablet    Refill:  11    Return in about 4 months (around 10/02/2016) for recheck.   Ardice Boyan Daphine Deutscher  Tamala Julian, M.D. [facility] [address] [place], [place]  [phone] phone [phone] fax

## 2016-06-04 LAB — CERULOPLASMIN: Ceruloplasmin: 28 mg/dL (ref 18–53)

## 2016-06-05 LAB — METHYLMALONIC ACID, SERUM: METHYLMALONIC ACID, QUANT: 290 nmol/L (ref 87–318)

## 2016-06-11 DIAGNOSIS — I10 Essential (primary) hypertension: Secondary | ICD-10-CM | POA: Diagnosis not present

## 2016-06-11 DIAGNOSIS — Z8673 Personal history of transient ischemic attack (TIA), and cerebral infarction without residual deficits: Secondary | ICD-10-CM | POA: Diagnosis not present

## 2016-06-11 DIAGNOSIS — G1221 Amyotrophic lateral sclerosis: Secondary | ICD-10-CM | POA: Diagnosis not present

## 2016-06-11 DIAGNOSIS — R471 Dysarthria and anarthria: Secondary | ICD-10-CM | POA: Diagnosis not present

## 2016-06-11 DIAGNOSIS — F329 Major depressive disorder, single episode, unspecified: Secondary | ICD-10-CM | POA: Diagnosis not present

## 2016-06-11 DIAGNOSIS — Z862 Personal history of diseases of the blood and blood-forming organs and certain disorders involving the immune mechanism: Secondary | ICD-10-CM | POA: Diagnosis not present

## 2016-06-11 DIAGNOSIS — E039 Hypothyroidism, unspecified: Secondary | ICD-10-CM | POA: Diagnosis not present

## 2016-06-11 DIAGNOSIS — G4700 Insomnia, unspecified: Secondary | ICD-10-CM | POA: Diagnosis not present

## 2016-06-11 DIAGNOSIS — S59912S Unspecified injury of left forearm, sequela: Secondary | ICD-10-CM | POA: Diagnosis not present

## 2016-06-11 DIAGNOSIS — Z7982 Long term (current) use of aspirin: Secondary | ICD-10-CM | POA: Diagnosis not present

## 2016-06-11 DIAGNOSIS — F419 Anxiety disorder, unspecified: Secondary | ICD-10-CM | POA: Diagnosis not present

## 2016-06-11 DIAGNOSIS — E7800 Pure hypercholesterolemia, unspecified: Secondary | ICD-10-CM | POA: Diagnosis not present

## 2016-06-11 DIAGNOSIS — R4702 Dysphasia: Secondary | ICD-10-CM | POA: Diagnosis not present

## 2016-06-11 DIAGNOSIS — M79632 Pain in left forearm: Secondary | ICD-10-CM | POA: Diagnosis not present

## 2016-06-11 DIAGNOSIS — Z79899 Other long term (current) drug therapy: Secondary | ICD-10-CM | POA: Diagnosis not present

## 2016-06-22 ENCOUNTER — Ambulatory Visit: Payer: Medicare Other | Admitting: Internal Medicine

## 2016-07-16 DIAGNOSIS — Z79899 Other long term (current) drug therapy: Secondary | ICD-10-CM | POA: Diagnosis not present

## 2016-07-16 DIAGNOSIS — R1313 Dysphagia, pharyngeal phase: Secondary | ICD-10-CM | POA: Diagnosis not present

## 2016-07-16 DIAGNOSIS — R1310 Dysphagia, unspecified: Secondary | ICD-10-CM | POA: Diagnosis not present

## 2016-07-16 DIAGNOSIS — E039 Hypothyroidism, unspecified: Secondary | ICD-10-CM | POA: Diagnosis not present

## 2016-07-16 DIAGNOSIS — R633 Feeding difficulties: Secondary | ICD-10-CM | POA: Diagnosis not present

## 2016-07-16 DIAGNOSIS — Z7982 Long term (current) use of aspirin: Secondary | ICD-10-CM | POA: Diagnosis not present

## 2016-07-20 DIAGNOSIS — Z79899 Other long term (current) drug therapy: Secondary | ICD-10-CM | POA: Diagnosis not present

## 2016-07-20 DIAGNOSIS — E039 Hypothyroidism, unspecified: Secondary | ICD-10-CM | POA: Diagnosis not present

## 2016-07-20 DIAGNOSIS — E7800 Pure hypercholesterolemia, unspecified: Secondary | ICD-10-CM | POA: Diagnosis not present

## 2016-07-20 DIAGNOSIS — R0981 Nasal congestion: Secondary | ICD-10-CM | POA: Diagnosis not present

## 2016-07-20 DIAGNOSIS — F419 Anxiety disorder, unspecified: Secondary | ICD-10-CM | POA: Diagnosis not present

## 2016-07-20 DIAGNOSIS — Z7982 Long term (current) use of aspirin: Secondary | ICD-10-CM | POA: Diagnosis not present

## 2016-07-20 DIAGNOSIS — R0982 Postnasal drip: Secondary | ICD-10-CM | POA: Diagnosis not present

## 2016-07-20 DIAGNOSIS — G4700 Insomnia, unspecified: Secondary | ICD-10-CM | POA: Diagnosis not present

## 2016-07-21 DIAGNOSIS — G1221 Amyotrophic lateral sclerosis: Secondary | ICD-10-CM | POA: Diagnosis not present

## 2016-07-28 ENCOUNTER — Other Ambulatory Visit: Payer: Self-pay | Admitting: Family Medicine

## 2016-07-28 DIAGNOSIS — R1319 Other dysphagia: Secondary | ICD-10-CM

## 2016-08-19 DIAGNOSIS — J9589 Other postprocedural complications and disorders of respiratory system, not elsewhere classified: Secondary | ICD-10-CM | POA: Diagnosis not present

## 2016-08-19 DIAGNOSIS — R490 Dysphonia: Secondary | ICD-10-CM | POA: Diagnosis not present

## 2016-08-31 DIAGNOSIS — R4702 Dysphasia: Secondary | ICD-10-CM | POA: Diagnosis not present

## 2016-08-31 DIAGNOSIS — S59912S Unspecified injury of left forearm, sequela: Secondary | ICD-10-CM | POA: Diagnosis not present

## 2016-08-31 DIAGNOSIS — G4700 Insomnia, unspecified: Secondary | ICD-10-CM | POA: Diagnosis not present

## 2016-08-31 DIAGNOSIS — F419 Anxiety disorder, unspecified: Secondary | ICD-10-CM | POA: Diagnosis not present

## 2016-08-31 DIAGNOSIS — F329 Major depressive disorder, single episode, unspecified: Secondary | ICD-10-CM | POA: Diagnosis not present

## 2016-08-31 DIAGNOSIS — M79632 Pain in left forearm: Secondary | ICD-10-CM | POA: Diagnosis not present

## 2016-08-31 DIAGNOSIS — E7800 Pure hypercholesterolemia, unspecified: Secondary | ICD-10-CM | POA: Diagnosis not present

## 2016-08-31 DIAGNOSIS — I10 Essential (primary) hypertension: Secondary | ICD-10-CM | POA: Diagnosis not present

## 2016-08-31 DIAGNOSIS — Z862 Personal history of diseases of the blood and blood-forming organs and certain disorders involving the immune mechanism: Secondary | ICD-10-CM | POA: Diagnosis not present

## 2016-08-31 DIAGNOSIS — R471 Dysarthria and anarthria: Secondary | ICD-10-CM | POA: Diagnosis not present

## 2016-08-31 DIAGNOSIS — Z79899 Other long term (current) drug therapy: Secondary | ICD-10-CM | POA: Diagnosis not present

## 2016-08-31 DIAGNOSIS — R490 Dysphonia: Secondary | ICD-10-CM | POA: Diagnosis not present

## 2016-08-31 DIAGNOSIS — G1221 Amyotrophic lateral sclerosis: Secondary | ICD-10-CM | POA: Diagnosis not present

## 2016-08-31 DIAGNOSIS — Z7982 Long term (current) use of aspirin: Secondary | ICD-10-CM | POA: Diagnosis not present

## 2016-08-31 DIAGNOSIS — Z8673 Personal history of transient ischemic attack (TIA), and cerebral infarction without residual deficits: Secondary | ICD-10-CM | POA: Diagnosis not present

## 2016-08-31 DIAGNOSIS — T17908A Unspecified foreign body in respiratory tract, part unspecified causing other injury, initial encounter: Secondary | ICD-10-CM | POA: Diagnosis not present

## 2016-08-31 DIAGNOSIS — E039 Hypothyroidism, unspecified: Secondary | ICD-10-CM | POA: Diagnosis not present

## 2016-09-16 DIAGNOSIS — G1221 Amyotrophic lateral sclerosis: Secondary | ICD-10-CM

## 2016-09-16 HISTORY — DX: Amyotrophic lateral sclerosis: G12.21

## 2016-10-04 DIAGNOSIS — G1221 Amyotrophic lateral sclerosis: Secondary | ICD-10-CM | POA: Insufficient documentation

## 2016-10-05 ENCOUNTER — Ambulatory Visit: Payer: Medicare Other | Admitting: Family Medicine

## 2016-10-05 DIAGNOSIS — Z5181 Encounter for therapeutic drug level monitoring: Secondary | ICD-10-CM | POA: Diagnosis not present

## 2016-10-05 DIAGNOSIS — G1221 Amyotrophic lateral sclerosis: Secondary | ICD-10-CM | POA: Diagnosis not present

## 2016-10-05 DIAGNOSIS — R1319 Other dysphagia: Secondary | ICD-10-CM | POA: Diagnosis not present

## 2016-10-05 DIAGNOSIS — G1229 Other motor neuron disease: Secondary | ICD-10-CM | POA: Diagnosis not present

## 2016-10-05 DIAGNOSIS — M6281 Muscle weakness (generalized): Secondary | ICD-10-CM | POA: Diagnosis not present

## 2016-10-05 DIAGNOSIS — R942 Abnormal results of pulmonary function studies: Secondary | ICD-10-CM | POA: Diagnosis not present

## 2016-10-05 DIAGNOSIS — E538 Deficiency of other specified B group vitamins: Secondary | ICD-10-CM | POA: Diagnosis not present

## 2016-10-05 DIAGNOSIS — E559 Vitamin D deficiency, unspecified: Secondary | ICD-10-CM | POA: Diagnosis not present

## 2016-10-05 DIAGNOSIS — R633 Feeding difficulties: Secondary | ICD-10-CM | POA: Diagnosis not present

## 2016-10-16 IMAGING — RF DG ESOPHAGUS
9 of 14 series · 14 of 23 positions shown · non-contrast
Comparison: None.

CLINICAL DATA: Difficulty swallowing.  Small things get stuck.

EXAM:
ESOPHOGRAM / BARIUM SWALLOW / BARIUM TABLET STUDY
TECHNIQUE: Combined double contrast and single contrast examination performed
using effervescent crystals, thick barium liquid, and thin barium
liquid. The patient was observed with fluoroscopy swallowing a 13 mm
barium sulphate tablet.
FLUOROSCOPY TIME:  Radiation Exposure Index (as provided by the
fluoroscopic device): 14.1 mGy

[Series 1: cp_standard · 0.51mm/px · 2 of 35 frames shown (1 of 9)]
[frame 6/35]
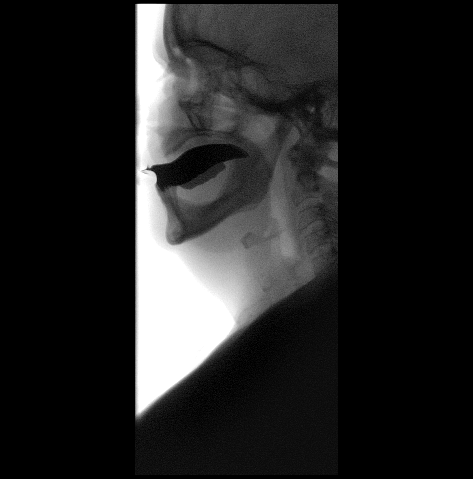
[frame 23/35]
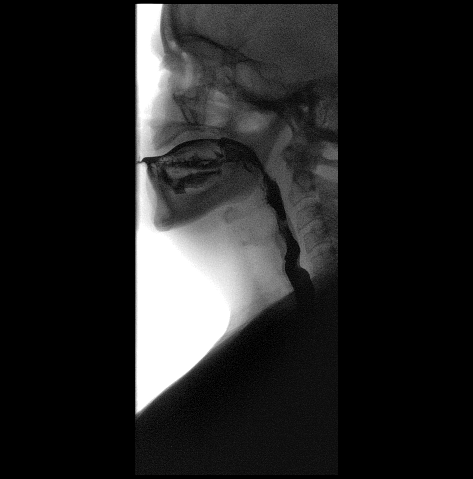

[Series 2: cp_standard · 0.51mm/px · 3 of 26 frames shown (2 of 9)]
[frame 2/26]
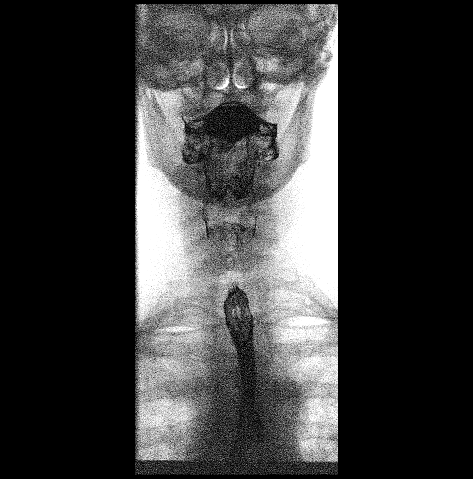
[frame 4/26]
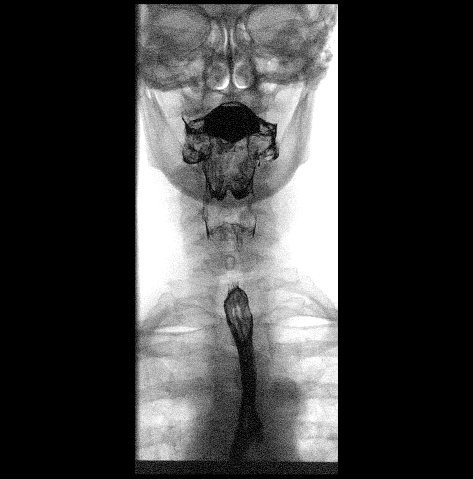
[frame 23/26]
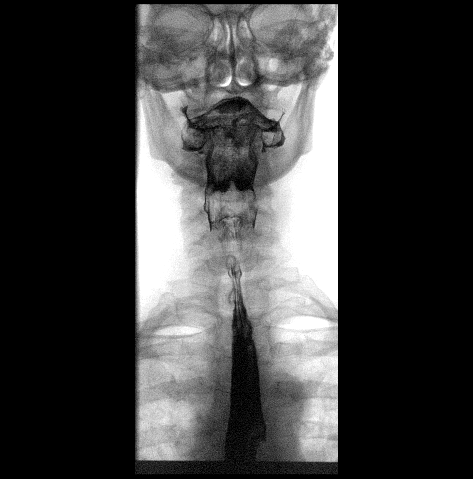

[Series 4: cp_standard · 0.25mm/px · 1 of 1 slices shown (3 of 9)]
[im 1/1]
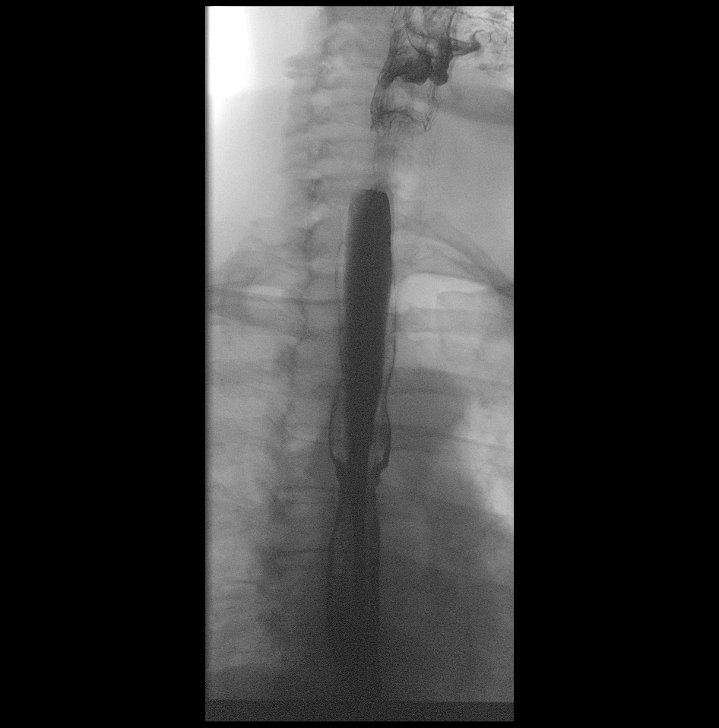

[Series 5: cp_standard · 0.51mm/px · 3 of 36 frames shown (4 of 9)]
[frame 6/36]
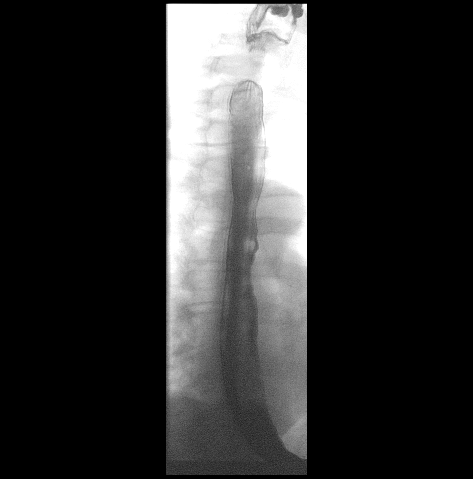
[frame 19/36]
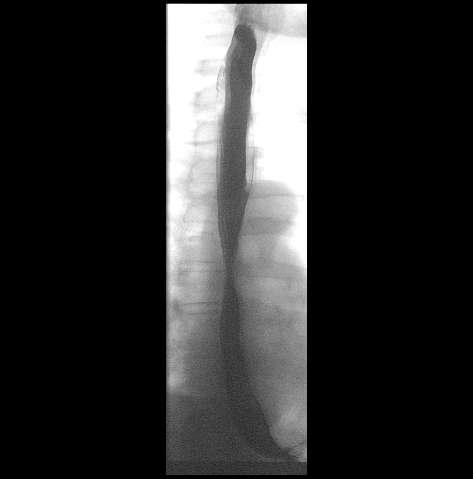
[frame 31/36]
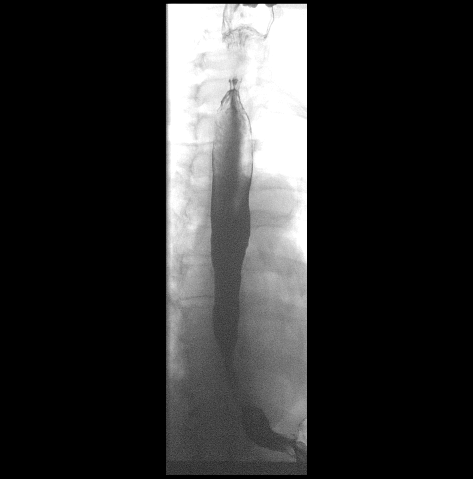

[Series 7: cp_standard · 0.25mm/px · 1 of 1 slices shown (5 of 9)]
[im 1/1]
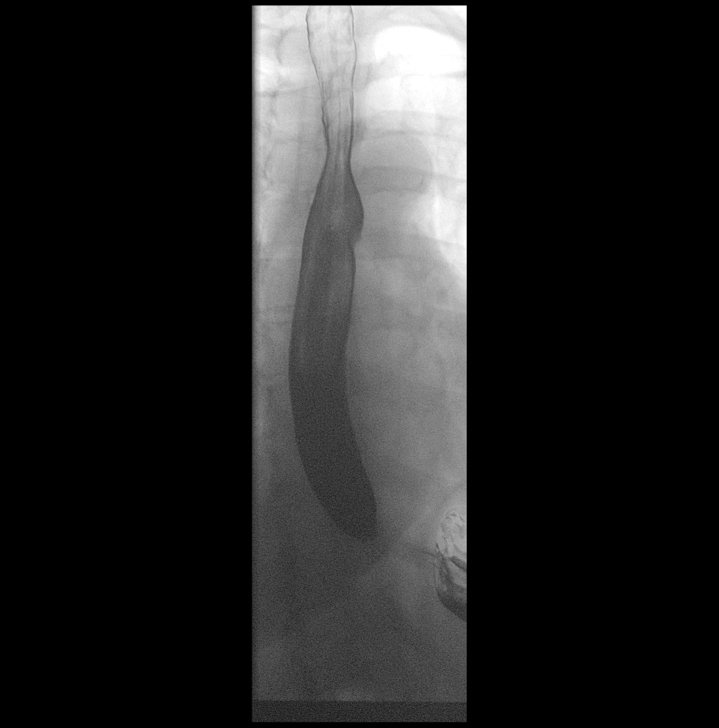

[Series 9: cp_standard · 0.25mm/px · 1 of 1 slices shown (6 of 9)]
[im 1/1]
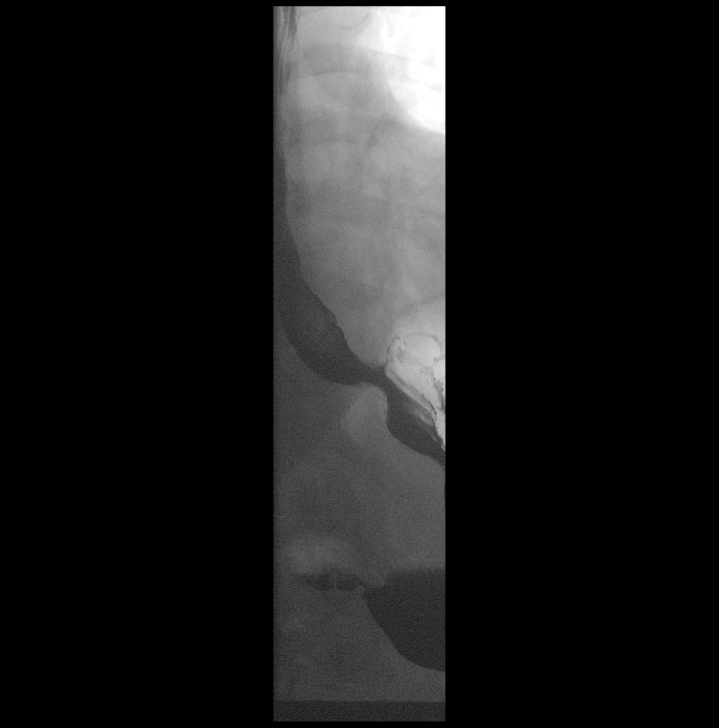

[Series 10: cp_standard · 0.25mm/px · 1 of 1 slices shown (7 of 9)]
[im 1/1]
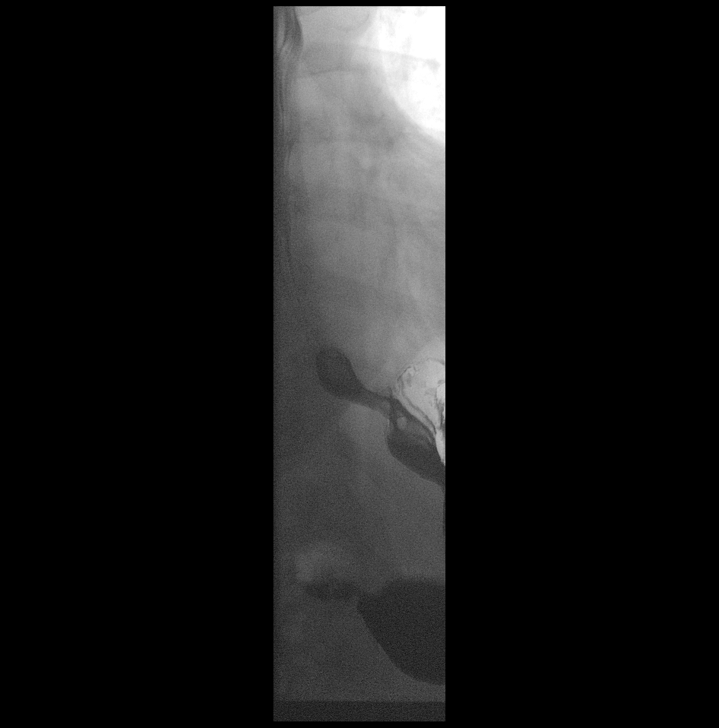

[Series 12: cp_standard · 0.27mm/px · 1 of 1 slices shown (8 of 9)]
[im 1/1]
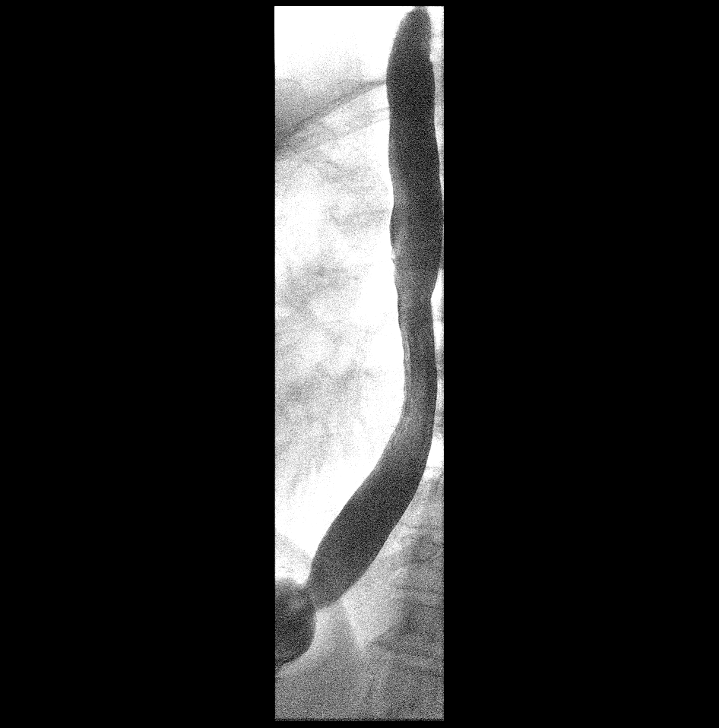

[Series 14: cp_standard · 0.27mm/px · 1 of 1 slices shown (9 of 9)]
[im 1/1]
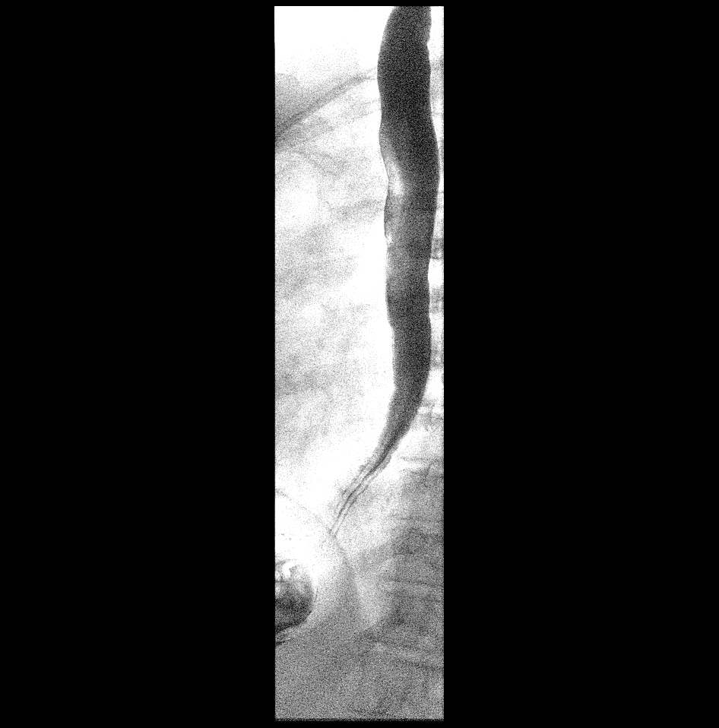

[14 of 23 positions shown; findings below may reference images not displayed]

FINDINGS: There was normal pharyngeal anatomy and motility. Contrast flowed
freely through the esophagus without evidence of stricture or mass.
There was normal esophageal mucosa without evidence of irregularity
or ulceration. Esophageal motility was normal. No evidence of
reflux. No definite hiatal hernia was demonstrated.

At the end of the examination a 13 mm barium tablet was administered
which transited through the esophagus and esophagogastric junction
without delay.
IMPRESSION: Normal barium swallow.

## 2016-10-18 ENCOUNTER — Other Ambulatory Visit: Payer: Self-pay | Admitting: Family Medicine

## 2016-10-18 NOTE — Telephone Encounter (Signed)
Last visit and refill 03/2016

## 2016-10-20 DIAGNOSIS — R6339 Other feeding difficulties: Secondary | ICD-10-CM | POA: Insufficient documentation

## 2016-10-20 DIAGNOSIS — R633 Feeding difficulties: Secondary | ICD-10-CM | POA: Insufficient documentation

## 2016-10-21 NOTE — Telephone Encounter (Signed)
Called to pharmacy 

## 2016-10-27 ENCOUNTER — Ambulatory Visit (INDEPENDENT_AMBULATORY_CARE_PROVIDER_SITE_OTHER): Payer: Medicare Other | Admitting: Family Medicine

## 2016-10-27 ENCOUNTER — Encounter: Payer: Self-pay | Admitting: Family Medicine

## 2016-10-27 VITALS — BP 136/64 | HR 60 | Temp 97.7°F | Resp 16 | Ht 63.0 in | Wt 215.4 lb

## 2016-10-27 DIAGNOSIS — E538 Deficiency of other specified B group vitamins: Secondary | ICD-10-CM | POA: Diagnosis not present

## 2016-10-27 DIAGNOSIS — R471 Dysarthria and anarthria: Secondary | ICD-10-CM | POA: Diagnosis not present

## 2016-10-27 DIAGNOSIS — E034 Atrophy of thyroid (acquired): Secondary | ICD-10-CM

## 2016-10-27 DIAGNOSIS — F5101 Primary insomnia: Secondary | ICD-10-CM

## 2016-10-27 DIAGNOSIS — F419 Anxiety disorder, unspecified: Secondary | ICD-10-CM

## 2016-10-27 DIAGNOSIS — Z23 Encounter for immunization: Secondary | ICD-10-CM | POA: Diagnosis not present

## 2016-10-27 DIAGNOSIS — E7800 Pure hypercholesterolemia, unspecified: Secondary | ICD-10-CM

## 2016-10-27 DIAGNOSIS — G1221 Amyotrophic lateral sclerosis: Secondary | ICD-10-CM | POA: Diagnosis not present

## 2016-10-27 DIAGNOSIS — F418 Other specified anxiety disorders: Secondary | ICD-10-CM | POA: Diagnosis not present

## 2016-10-27 DIAGNOSIS — R7309 Other abnormal glucose: Secondary | ICD-10-CM | POA: Diagnosis not present

## 2016-10-27 DIAGNOSIS — I10 Essential (primary) hypertension: Secondary | ICD-10-CM | POA: Diagnosis not present

## 2016-10-27 DIAGNOSIS — F5104 Psychophysiologic insomnia: Secondary | ICD-10-CM

## 2016-10-27 DIAGNOSIS — E559 Vitamin D deficiency, unspecified: Secondary | ICD-10-CM

## 2016-10-27 DIAGNOSIS — F32A Depression, unspecified: Secondary | ICD-10-CM

## 2016-10-27 DIAGNOSIS — F329 Major depressive disorder, single episode, unspecified: Secondary | ICD-10-CM

## 2016-10-27 MED ORDER — ATORVASTATIN CALCIUM 20 MG PO TABS: ORAL_TABLET | ORAL | 5 refills | Status: DC

## 2016-10-27 MED ORDER — SERTRALINE HCL 100 MG PO TABS: ORAL_TABLET | ORAL | 5 refills | Status: DC

## 2016-10-27 MED ORDER — LEVOTHYROXINE SODIUM 112 MCG PO TABS: 112.0000 ug | ORAL_TABLET | Freq: Every day | ORAL | 5 refills | Status: DC

## 2016-10-27 MED ORDER — LOSARTAN POTASSIUM 25 MG PO TABS: 25.0000 mg | ORAL_TABLET | Freq: Every day | ORAL | 5 refills | Status: DC

## 2016-10-27 MED ORDER — ALPRAZOLAM 0.25 MG PO TABS: 0.5000 mg | ORAL_TABLET | Freq: Every evening | ORAL | 5 refills | Status: DC | PRN

## 2016-10-27 MED ORDER — TRAZODONE HCL 50 MG PO TABS: 50.0000 mg | ORAL_TABLET | Freq: Every evening | ORAL | 5 refills | Status: DC | PRN

## 2016-10-27 NOTE — Patient Instructions (Signed)
     IF you received an x-ray today, you will receive an invoice from [facility]. Please contact [facility] at [phone] with questions or concerns regarding your invoice.   IF you received labwork today, you will receive an invoice from [facility]. Please contact [facility] at [phone] with questions or concerns regarding your invoice.   Our billing staff will not be able to assist you with questions regarding bills from these companies.  You will be contacted with the lab results as soon as they are available. The fastest way to get your results is to activate your My Chart account. Instructions are located on the last page of this paperwork. If you have not heard from us regarding the results in 2 weeks, please contact this office.     

## 2016-10-27 NOTE — Progress Notes (Signed)
Subjective:    Patient ID: Carolyn Campos, female    DOB: [birth year], 66 y.o.   MRN: [id number]  10/27/2016  Medication Refill (PT states- All Med need refill)   HPI This 66 y.o. female presents for follow-up from 05/2016 for hypertension, hypercholesterolemia, anxiety/depression,hypothyroidism.  Here also to discuss new dx of ALS.  Presents with daughter, Carolyn Campos, today to help interpret due to patient's decline in verbal communication since last visit.    Saw surgeon for feeding tube.  Feeding tube scheduled November 24, 2015.  Non-invasive trilogy now ventilation; BiPAP did not work.  [facility] changed treat.  Cough assist and suction at home.  Still choking easily.  Wear Trilogy atleast 4 hours.   Goal 4 hours a day.  Recommended increasing to 8 hours per day.  Oxygen level never goes down.   Pulmonary function never decreases.  Forgets words; having word finding difficulties.  No memory issues.  Does laugh inappropriately.  No depression.  Weight is stable.  Recommend maintaining weight.  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  Immunization History  Administered Date(s) Administered  . Influenza Split 05/16/2013, 06/02/2015  . Influenza-Unspecified 05/16/2014, 05/16/2016  . Pneumococcal Conjugate-13 10/27/2016  . Td 08/17/2007   BP Readings from Last 3 Encounters:  10/27/16 136/64  06/01/16 136/64  03/30/16 122/70   Wt Readings from Last 3 Encounters:  10/27/16 215 lb 6.4 oz (97.7 kg)  06/01/16 213 lb (96.6 kg)  03/30/16 217 lb (98.4 kg)      Review of Systems  Constitutional: Negative for chills, diaphoresis, fatigue and fever.  HENT: Positive for trouble swallowing.   Eyes: Negative for visual disturbance.  Respiratory: Negative for cough and shortness of breath.   Cardiovascular: Negative for chest pain, palpitations and leg swelling.  Gastrointestinal: Negative for abdominal pain, constipation,  diarrhea, nausea and vomiting.  Endocrine: Negative for cold intolerance, heat intolerance, polydipsia, polyphagia and polyuria.  Neurological: Negative for dizziness, tremors, seizures, syncope, facial asymmetry, speech difficulty, weakness, light-headedness, numbness and headaches.  Psychiatric/Behavioral: Negative for dysphoric mood and sleep disturbance. The patient is not nervous/anxious.     Past Medical History:  Diagnosis Date  . Abdominal pain, unspecified site   . 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 Anxiety state, unspecified   . Chest pain, unspecified   . Chest pain, unspecified   . Depressive disorder, not elsewhere classified   . Heart murmur    hx  . Hypertension   . Insomnia, unspecified   . Postmenopausal bleeding   . Pure hypercholesterolemia   . Shortness of breath dyspnea   . Unspecified hypothyroidism    Past Surgical History:  Procedure Laterality Date  . ABDOMINAL HYSTERECTOMY  04/16/2012   one ovary resected.  No malignancy.  Post-menopausal bleeding.  [facility].  Marland Kitchen arm surgery     x 4 post accident at work, with skin graft, [facility]  . CESAREAN SECTION     x 2  . COLONOSCOPY  08/16/2009   normal.  [facility].  Repeat in 10 years.  Marland Kitchen DILATION AND CURETTAGE OF UTERUS    . ESOPHAGOGASTRODUODENOSCOPY (EGD) WITH PROPOFOL N/A 12/01/2015   Procedure: ESOPHAGOGASTRODUODENOSCOPY (EGD) WITH PROPOFOL;  Surgeon: Christena Deem, MD;  Location: [facility] ENDOSCOPY;  Service: Endoscopy;  Laterality: N/A;  . ESOPHAGOGASTRODUODENOSCOPY (EGD) WITH PROPOFOL N/A 03/15/2016   Procedure: ESOPHAGOGASTRODUODENOSCOPY (EGD) WITH PROPOFOL;  Surgeon: Christena Deem, MD;  Location: [facility] ENDOSCOPY;  Service:  Endoscopy;  Laterality: N/A;   Allergies  Allergen Reactions  . Cephalexin Rash   Current Outpatient Prescriptions  Medication Sig Dispense Refill  . albuterol (PROVENTIL HFA;VENTOLIN HFA) 108 (90 Base) MCG/ACT inhaler Inhale 2 puffs into the lungs every  6 (six) hours as needed for wheezing or shortness of breath. 1 Inhaler 4  . ALPRAZolam (XANAX) 0.25 MG tablet Take 2 tablets (0.5 mg total) by mouth at bedtime as needed. 30 tablet 5  . aspirin 81 MG tablet Take 81 mg by mouth daily.    Marland Kitchen atorvastatin (LIPITOR) 20 MG tablet TAKE ONE (1) TABLET BY MOUTH ONCE DAILY 30 tablet 5  . Fiber TABS 500 mg. Take one daily.    . fish oil-omega-3 fatty acids 1000 MG capsule Take 2 g by mouth daily.    . folic acid (FOLVITE) 1 MG tablet Take 1 tablet (1 mg total) by mouth daily. 30 tablet 11  . levothyroxine (SYNTHROID, LEVOTHROID) 112 MCG tablet Take 1 tablet (112 mcg total) by mouth daily. 30 tablet 5  . losartan (COZAAR) 25 MG tablet Take 1 tablet (25 mg total) by mouth daily. 30 tablet 5  . riluzole (RILUTEK) 50 MG tablet Take by mouth.    . sertraline (ZOLOFT) 100 MG tablet 1 tablet daily 30 tablet 5  . traZODone (DESYREL) 50 MG tablet Take 1 tablet (50 mg total) by mouth at bedtime as needed. 30 tablet 5  . triamcinolone cream (KENALOG) 0.1 % Apply topically 2 (two) times daily. 30 g 0  . vitamin B-12 (CYANOCOBALAMIN) 1000 MCG tablet Take 1 tablet (1,000 mcg total) by mouth daily. 30 tablet 11   No current facility-administered medications for this visit.    Social History   Social History  . Marital status: Married    Spouse name: N/A  . Number of children: 2  . Years of education: 12   Occupational History  . disabled     due to arm surgery   Social History Main Topics  . Smoking status: Never Smoker  . Smokeless tobacco: Never Used  . Alcohol use No  . Drug use: No  . Sexual activity: Not on file   Other Topics Concern  . Not on file   Social History Narrative   Sexual activity:  Not sexually active.       Seatbelt:  Always uses seat belts.       Smoke alarm and carbon monoxide detector in the home.       Guns: Has no unsecured guns in the home.       Caffeine:  Does not consume any caffeine.       Exercise: walks daily for 30  minutes walks 1-2 miles about every other day. Has dog.       Marital status:  Married x 41 years; moderately happy; married; no abuse;       Children:  2 daughters, 3 grandchildren/sons.       Organ donor: Yes.       Living Will: Patient does not have living will, DOES have HCPOA.      Employment: disability 2010 for [employer] comp injury arm.   Family History  Problem Relation Age of Onset  . Heart disease Brother     from drug abuse  . Alcohol abuse Brother   . Cirrhosis Brother   . COPD Mother   . Heart disease Mother     CHF  . Heart failure Mother   . Pulmonary fibrosis Mother  died in 2009  . Cancer Father     lung, prostate  . Hyperlipidemia Father   . Heart disease Father 6    CABG  . Ulcers Brother   . Breast cancer Paternal Aunt 80       Objective:    BP 136/64 (BP Location: Right Arm, Patient Position: Sitting, Cuff Size: Large)   Pulse 60   Temp 97.7 F (36.5 C) (Oral)   Resp 16   Ht 5\' 3"  (1.6 m)   Wt 215 lb 6.4 oz (97.7 kg)   SpO2 96%   BMI 38.16 kg/m  Physical Exam  Constitutional: She is oriented to person, place, and time. She appears well-developed and well-nourished. No distress.  HENT:  Head: Normocephalic and atraumatic.  Right Ear: External ear normal.  Left Ear: External ear normal.  Nose: Nose normal.  Mouth/Throat: Oropharynx is clear and moist.  Eyes: Conjunctivae and EOM are normal. Pupils are equal, round, and reactive to light.  Neck: Normal range of motion. Neck supple. Carotid bruit is not present. No thyromegaly present.  Cardiovascular: Normal rate, regular rhythm, normal heart sounds and intact distal pulses.  Exam reveals no gallop and no friction rub.   No murmur heard. Pulmonary/Chest: Effort normal and breath sounds normal. She has no wheezes. She has no rales.  Abdominal: Soft. Bowel sounds are normal. She exhibits no distension and no mass. There is no tenderness. There is no rebound and no guarding.  Lymphadenopathy:     She has no cervical adenopathy.  Neurological: She is alert and oriented to person, place, and time. No cranial nerve deficit. She exhibits normal muscle tone. Coordination normal.  Slurred speech; very difficult to understand.  Skin: Skin is warm and dry. No rash noted. She is not diaphoretic. No erythema. No pallor.  Psychiatric: She has a normal mood and affect. Her behavior is normal.   Depression screen [facility] 2/9 10/27/2016 06/01/2016 09/15/2015 06/16/2015 11/11/2014  Decreased Interest 0 0 1 3 1   Down, Depressed, Hopeless 0 0 3 2 1   PHQ - 2 Score 0 0 4 5 2   Altered sleeping - - 0 1 0  Tired, decreased energy - - 3 3 1   Change in appetite - - 3 3 1   Feeling bad or failure about yourself  - - 3 3 1   Trouble concentrating - - 2 1 1   Moving slowly or fidgety/restless - - 2 0 0  Suicidal thoughts - - 2 1 0  PHQ-9 Score - - 19 17 6    Fall Risk  10/27/2016 06/01/2016 09/15/2015 06/16/2015 11/11/2014  Falls in the past year? No Yes No No No  Number falls in past yr: - 1 - - -        Assessment & Plan:   1. Essential hypertension, benign   2. Hypothyroidism due to acquired atrophy of thyroid   3. Anxiety and depression   4. Psychophysiological insomnia   5. Other abnormal glucose   6. Pure hypercholesterolemia   7. Dysarthria   8. Amyotrophic lateral sclerosis (ALS) (HCC)   9. Vitamin B12 deficiency   10. Vitamin D deficiency   11. Essential hypertension   12. Primary insomnia    -new diagnosis of ALS; records reviewed in detail during visit; excellent family support and everyone coping well.   -obtain labs for chronic disease management. -refills of all medications provided. -s/p Prevnar 13. -no changes to management other than decreasing dose of Xanax.   -prolonged face to face  for 45 minutes with greater than 50% of time dedicated to coordination of care and counseling.   Orders Placed This Encounter  Procedures  . Pneumococcal conjugate vaccine 13-valent IM  . CBC  with Differential/Platelet  . Comprehensive metabolic panel    Order Specific Question:   Has the patient fasted?    Answer:   Yes  . Hemoglobin A1c  . Lipid panel    Order Specific Question:   Has the patient fasted?    Answer:   Yes  . T4, free  . TSH   Meds ordered this encounter  Medications  . DISCONTD: riluzole (RILUTEK) 50 MG tablet    Sig: Take 50 mg by mouth 2 (two) times daily.  . riluzole (RILUTEK) 50 MG tablet    Sig: Take by mouth.  Marland Kitchen. atorvastatin (LIPITOR) 20 MG tablet    Sig: TAKE ONE (1) TABLET BY MOUTH ONCE DAILY    Dispense:  30 tablet    Refill:  5  . levothyroxine (SYNTHROID, LEVOTHROID) 112 MCG tablet    Sig: Take 1 tablet (112 mcg total) by mouth daily.    Dispense:  30 tablet    Refill:  5  . losartan (COZAAR) 25 MG tablet    Sig: Take 1 tablet (25 mg total) by mouth daily.    Dispense:  30 tablet    Refill:  5  . sertraline (ZOLOFT) 100 MG tablet    Sig: 1 tablet daily    Dispense:  30 tablet    Refill:  5  . traZODone (DESYREL) 50 MG tablet    Sig: Take 1 tablet (50 mg total) by mouth at bedtime as needed.    Dispense:  30 tablet    Refill:  5  . ALPRAZolam (XANAX) 0.25 MG tablet    Sig: Take 2 tablets (0.5 mg total) by mouth at bedtime as needed.    Dispense:  30 tablet    Refill:  5    Return for complete physical examiniation.   Kristi Paulita FujitaMartin Smith, M.D. [facility]  [place] [facility] [address] [place], [place]  [id number] [phone] phone [phone] fax

## 2016-10-28 LAB — COMPREHENSIVE METABOLIC PANEL
A/G RATIO: 1.7 (ref 1.2–2.2)
ALBUMIN: 4.5 g/dL (ref 3.6–4.8)
ALK PHOS: 84 IU/L (ref 39–117)
ALT: 16 IU/L (ref 0–32)
AST: 19 IU/L (ref 0–40)
BUN / CREAT RATIO: 12 (ref 12–28)
BUN: 9 mg/dL (ref 8–27)
Bilirubin Total: 0.5 mg/dL (ref 0.0–1.2)
CHLORIDE: 100 mmol/L (ref 96–106)
CO2: 26 mmol/L (ref 18–29)
CREATININE: 0.73 mg/dL (ref 0.57–1.00)
Calcium: 10.3 mg/dL (ref 8.7–10.3)
GFR calc Af Amer: 100 mL/min/{1.73_m2} (ref >59)
GFR, EST NON AFRICAN AMERICAN: 87 mL/min/{1.73_m2} (ref >59)
GLOBULIN, TOTAL: 2.6 g/dL (ref 1.5–4.5)
Glucose: 98 mg/dL (ref 65–99)
POTASSIUM: 4.4 mmol/L (ref 3.5–5.2)
SODIUM: 142 mmol/L (ref 134–144)
Total Protein: 7.1 g/dL (ref 6.0–8.5)

## 2016-10-28 LAB — CBC WITH DIFFERENTIAL/PLATELET
BASOS: 1 %
Basophils Absolute: 0 10*3/uL (ref 0.0–0.2)
EOS (ABSOLUTE): 0.1 10*3/uL (ref 0.0–0.4)
EOS: 2 %
HEMATOCRIT: 36.8 % (ref 34.0–46.6)
HEMOGLOBIN: 12.6 g/dL (ref 11.1–15.9)
Immature Grans (Abs): 0 10*3/uL (ref 0.0–0.1)
Immature Granulocytes: 0 %
LYMPHS ABS: 2.6 10*3/uL (ref 0.7–3.1)
Lymphs: 41 %
MCH: 32 pg (ref 26.6–33.0)
MCHC: 34.2 g/dL (ref 31.5–35.7)
MCV: 93 fL (ref 79–97)
MONOCYTES: 7 %
MONOS ABS: 0.4 10*3/uL (ref 0.1–0.9)
NEUTROS ABS: 3.1 10*3/uL (ref 1.4–7.0)
Neutrophils: 49 %
Platelets: 271 10*3/uL (ref 150–379)
RBC: 3.94 x10E6/uL (ref 3.77–5.28)
RDW: 14.2 % (ref 12.3–15.4)
WBC: 6.2 10*3/uL (ref 3.4–10.8)

## 2016-10-28 LAB — TSH: TSH: 0.762 u[IU]/mL (ref 0.450–4.500)

## 2016-10-28 LAB — HEMOGLOBIN A1C
Est. average glucose Bld gHb Est-mCnc: 120 mg/dL
Hgb A1c MFr Bld: 5.8 % — ABNORMAL HIGH (ref 4.8–5.6)

## 2016-10-28 LAB — LIPID PANEL
CHOL/HDL RATIO: 2.2 ratio (ref 0.0–4.4)
CHOLESTEROL TOTAL: 153 mg/dL (ref 100–199)
HDL: 70 mg/dL (ref >39)
LDL Calculated: 66 mg/dL (ref 0–99)
TRIGLYCERIDES: 87 mg/dL (ref 0–149)
VLDL Cholesterol Cal: 17 mg/dL (ref 5–40)

## 2016-10-28 LAB — T4, FREE: Free T4: 1.17 ng/dL (ref 0.82–1.77)

## 2016-11-09 DIAGNOSIS — R471 Dysarthria and anarthria: Secondary | ICD-10-CM | POA: Diagnosis not present

## 2016-11-09 DIAGNOSIS — R1312 Dysphagia, oropharyngeal phase: Secondary | ICD-10-CM | POA: Diagnosis not present

## 2016-11-09 DIAGNOSIS — Z5181 Encounter for therapeutic drug level monitoring: Secondary | ICD-10-CM | POA: Diagnosis not present

## 2016-11-09 DIAGNOSIS — G1229 Other motor neuron disease: Secondary | ICD-10-CM | POA: Diagnosis not present

## 2016-11-09 DIAGNOSIS — G1221 Amyotrophic lateral sclerosis: Secondary | ICD-10-CM | POA: Diagnosis not present

## 2016-11-09 DIAGNOSIS — M6281 Muscle weakness (generalized): Secondary | ICD-10-CM | POA: Diagnosis not present

## 2016-11-09 DIAGNOSIS — R0602 Shortness of breath: Secondary | ICD-10-CM | POA: Diagnosis not present

## 2016-11-16 DIAGNOSIS — E039 Hypothyroidism, unspecified: Secondary | ICD-10-CM | POA: Diagnosis not present

## 2016-11-16 DIAGNOSIS — R471 Dysarthria and anarthria: Secondary | ICD-10-CM | POA: Diagnosis not present

## 2016-11-16 DIAGNOSIS — F419 Anxiety disorder, unspecified: Secondary | ICD-10-CM | POA: Diagnosis not present

## 2016-11-16 DIAGNOSIS — E669 Obesity, unspecified: Secondary | ICD-10-CM | POA: Diagnosis not present

## 2016-11-16 DIAGNOSIS — F329 Major depressive disorder, single episode, unspecified: Secondary | ICD-10-CM | POA: Diagnosis not present

## 2016-11-16 DIAGNOSIS — G1221 Amyotrophic lateral sclerosis: Secondary | ICD-10-CM | POA: Diagnosis not present

## 2016-11-16 DIAGNOSIS — I10 Essential (primary) hypertension: Secondary | ICD-10-CM | POA: Diagnosis not present

## 2016-11-16 DIAGNOSIS — R633 Feeding difficulties: Secondary | ICD-10-CM | POA: Diagnosis not present

## 2016-11-16 DIAGNOSIS — G4700 Insomnia, unspecified: Secondary | ICD-10-CM | POA: Diagnosis not present

## 2016-11-23 DIAGNOSIS — E785 Hyperlipidemia, unspecified: Secondary | ICD-10-CM | POA: Diagnosis not present

## 2016-11-23 DIAGNOSIS — Z8719 Personal history of other diseases of the digestive system: Secondary | ICD-10-CM | POA: Diagnosis not present

## 2016-11-23 DIAGNOSIS — I10 Essential (primary) hypertension: Secondary | ICD-10-CM | POA: Diagnosis not present

## 2016-11-23 DIAGNOSIS — R634 Abnormal weight loss: Secondary | ICD-10-CM | POA: Diagnosis not present

## 2016-11-23 DIAGNOSIS — R633 Feeding difficulties: Secondary | ICD-10-CM | POA: Diagnosis not present

## 2016-11-23 DIAGNOSIS — E039 Hypothyroidism, unspecified: Secondary | ICD-10-CM | POA: Diagnosis not present

## 2016-11-23 DIAGNOSIS — K219 Gastro-esophageal reflux disease without esophagitis: Secondary | ICD-10-CM | POA: Diagnosis not present

## 2016-11-23 DIAGNOSIS — G1221 Amyotrophic lateral sclerosis: Secondary | ICD-10-CM | POA: Diagnosis not present

## 2016-11-23 DIAGNOSIS — R1310 Dysphagia, unspecified: Secondary | ICD-10-CM | POA: Diagnosis not present

## 2016-12-02 DIAGNOSIS — R633 Feeding difficulties: Secondary | ICD-10-CM | POA: Diagnosis not present

## 2016-12-02 DIAGNOSIS — Z931 Gastrostomy status: Secondary | ICD-10-CM | POA: Diagnosis not present

## 2016-12-22 ENCOUNTER — Other Ambulatory Visit: Payer: Self-pay | Admitting: Family Medicine

## 2016-12-22 NOTE — Telephone Encounter (Signed)
Daughter calling requesting rx for Effexor originally as Dr. Zannie KehrBedlack is wanting to start pt on Reglan for worsening reflux. Then daughter called asking for Wellbutrin.  I sent in Wellbutrin XL 150mg  daily instead of 300mg  as requested.

## 2017-01-05 DIAGNOSIS — Z431 Encounter for attention to gastrostomy: Secondary | ICD-10-CM | POA: Diagnosis not present

## 2017-01-05 DIAGNOSIS — Z931 Gastrostomy status: Secondary | ICD-10-CM | POA: Diagnosis not present

## 2017-01-11 DIAGNOSIS — G709 Myoneural disorder, unspecified: Secondary | ICD-10-CM | POA: Diagnosis not present

## 2017-01-11 DIAGNOSIS — J984 Other disorders of lung: Secondary | ICD-10-CM | POA: Diagnosis not present

## 2017-01-11 DIAGNOSIS — Z6837 Body mass index (BMI) 37.0-37.9, adult: Secondary | ICD-10-CM | POA: Diagnosis not present

## 2017-01-11 DIAGNOSIS — G1221 Amyotrophic lateral sclerosis: Secondary | ICD-10-CM | POA: Diagnosis not present

## 2017-01-11 DIAGNOSIS — R634 Abnormal weight loss: Secondary | ICD-10-CM | POA: Diagnosis not present

## 2017-01-11 DIAGNOSIS — G1229 Other motor neuron disease: Secondary | ICD-10-CM | POA: Diagnosis not present

## 2017-01-11 DIAGNOSIS — Z5181 Encounter for therapeutic drug level monitoring: Secondary | ICD-10-CM | POA: Diagnosis not present

## 2017-01-11 DIAGNOSIS — M6281 Muscle weakness (generalized): Secondary | ICD-10-CM | POA: Diagnosis not present

## 2017-01-11 DIAGNOSIS — R471 Dysarthria and anarthria: Secondary | ICD-10-CM | POA: Diagnosis not present

## 2017-02-14 ENCOUNTER — Other Ambulatory Visit: Payer: Self-pay | Admitting: Family Medicine

## 2017-02-25 ENCOUNTER — Encounter: Payer: Self-pay | Admitting: Family Medicine

## 2017-02-28 MED ORDER — OMEPRAZOLE 40 MG PO CPDR: 40.0000 mg | DELAYED_RELEASE_CAPSULE | Freq: Two times a day (BID) | ORAL | 3 refills | Status: DC

## 2017-03-29 DIAGNOSIS — K219 Gastro-esophageal reflux disease without esophagitis: Secondary | ICD-10-CM | POA: Diagnosis not present

## 2017-03-29 DIAGNOSIS — G1221 Amyotrophic lateral sclerosis: Secondary | ICD-10-CM | POA: Diagnosis not present

## 2017-03-29 DIAGNOSIS — G709 Myoneural disorder, unspecified: Secondary | ICD-10-CM | POA: Diagnosis not present

## 2017-03-29 DIAGNOSIS — J984 Other disorders of lung: Secondary | ICD-10-CM | POA: Diagnosis not present

## 2017-03-29 DIAGNOSIS — Z5181 Encounter for therapeutic drug level monitoring: Secondary | ICD-10-CM | POA: Diagnosis not present

## 2017-04-15 ENCOUNTER — Other Ambulatory Visit: Payer: Self-pay | Admitting: Family Medicine

## 2017-04-15 ENCOUNTER — Telehealth: Payer: Self-pay | Admitting: *Deleted

## 2017-04-15 NOTE — Telephone Encounter (Signed)
Faxed Rx for Xanax 0.25 mg to patient's pharmacy, per Dr Katrinka BlazingSmith. Confirmation page received at 4:55 pm.

## 2017-04-15 NOTE — Telephone Encounter (Signed)
Please call in refill of Xanax.

## 2017-05-09 ENCOUNTER — Other Ambulatory Visit: Payer: Self-pay | Admitting: Family Medicine

## 2017-05-09 DIAGNOSIS — I10 Essential (primary) hypertension: Secondary | ICD-10-CM

## 2017-05-09 DIAGNOSIS — F5101 Primary insomnia: Secondary | ICD-10-CM

## 2017-05-19 ENCOUNTER — Other Ambulatory Visit: Payer: Self-pay | Admitting: Family Medicine

## 2017-05-19 ENCOUNTER — Other Ambulatory Visit: Payer: Self-pay

## 2017-05-19 NOTE — Telephone Encounter (Signed)
Refill req Levothyroxine Sent #30 days Overdue for Complete Physical per last note from Dr. [name]. To scheduling to call pt.

## 2017-06-13 DIAGNOSIS — Z23 Encounter for immunization: Secondary | ICD-10-CM | POA: Diagnosis not present

## 2017-06-17 ENCOUNTER — Other Ambulatory Visit: Payer: Self-pay | Admitting: Family Medicine

## 2017-06-28 DIAGNOSIS — M6281 Muscle weakness (generalized): Secondary | ICD-10-CM | POA: Diagnosis not present

## 2017-06-28 DIAGNOSIS — Z9911 Dependence on respirator [ventilator] status: Secondary | ICD-10-CM | POA: Diagnosis not present

## 2017-06-28 DIAGNOSIS — J984 Other disorders of lung: Secondary | ICD-10-CM | POA: Diagnosis not present

## 2017-06-28 DIAGNOSIS — R942 Abnormal results of pulmonary function studies: Secondary | ICD-10-CM | POA: Diagnosis not present

## 2017-06-28 DIAGNOSIS — G1229 Other motor neuron disease: Secondary | ICD-10-CM | POA: Diagnosis not present

## 2017-06-28 DIAGNOSIS — Z5181 Encounter for therapeutic drug level monitoring: Secondary | ICD-10-CM | POA: Diagnosis not present

## 2017-06-28 DIAGNOSIS — G1221 Amyotrophic lateral sclerosis: Secondary | ICD-10-CM | POA: Diagnosis not present

## 2017-06-28 DIAGNOSIS — G709 Myoneural disorder, unspecified: Secondary | ICD-10-CM | POA: Diagnosis not present

## 2017-07-16 ENCOUNTER — Other Ambulatory Visit: Payer: Self-pay | Admitting: Family Medicine

## 2017-07-16 DIAGNOSIS — I10 Essential (primary) hypertension: Secondary | ICD-10-CM

## 2017-07-18 ENCOUNTER — Ambulatory Visit (INDEPENDENT_AMBULATORY_CARE_PROVIDER_SITE_OTHER): Payer: Medicare Other | Admitting: Family Medicine

## 2017-07-18 ENCOUNTER — Other Ambulatory Visit: Payer: Self-pay

## 2017-07-18 ENCOUNTER — Encounter: Payer: Self-pay | Admitting: Family Medicine

## 2017-07-18 VITALS — BP 122/70 | HR 76 | Temp 98.0°F | Resp 16 | Ht 63.39 in | Wt 191.0 lb

## 2017-07-18 DIAGNOSIS — K219 Gastro-esophageal reflux disease without esophagitis: Secondary | ICD-10-CM | POA: Diagnosis not present

## 2017-07-18 DIAGNOSIS — F419 Anxiety disorder, unspecified: Secondary | ICD-10-CM | POA: Diagnosis not present

## 2017-07-18 DIAGNOSIS — R1314 Dysphagia, pharyngoesophageal phase: Secondary | ICD-10-CM | POA: Diagnosis not present

## 2017-07-18 DIAGNOSIS — R471 Dysarthria and anarthria: Secondary | ICD-10-CM | POA: Diagnosis not present

## 2017-07-18 DIAGNOSIS — G1221 Amyotrophic lateral sclerosis: Secondary | ICD-10-CM

## 2017-07-18 DIAGNOSIS — I10 Essential (primary) hypertension: Secondary | ICD-10-CM | POA: Diagnosis not present

## 2017-07-18 DIAGNOSIS — E034 Atrophy of thyroid (acquired): Secondary | ICD-10-CM

## 2017-07-18 DIAGNOSIS — F5101 Primary insomnia: Secondary | ICD-10-CM | POA: Diagnosis not present

## 2017-07-18 DIAGNOSIS — R7309 Other abnormal glucose: Secondary | ICD-10-CM

## 2017-07-18 DIAGNOSIS — F329 Major depressive disorder, single episode, unspecified: Secondary | ICD-10-CM | POA: Diagnosis not present

## 2017-07-18 DIAGNOSIS — E7800 Pure hypercholesterolemia, unspecified: Secondary | ICD-10-CM

## 2017-07-18 LAB — POCT URINALYSIS DIP (MANUAL ENTRY)
BILIRUBIN UA: NEGATIVE mg/dL
Bilirubin, UA: NEGATIVE
Glucose, UA: NEGATIVE mg/dL
Nitrite, UA: NEGATIVE
PH UA: 8.5 — AB (ref 5.0–8.0)
PROTEIN UA: NEGATIVE mg/dL
RBC UA: NEGATIVE
SPEC GRAV UA: 1.015 (ref 1.010–1.025)
Urobilinogen, UA: 0.2 E.U./dL

## 2017-07-18 MED ORDER — ATORVASTATIN CALCIUM 20 MG PO TABS: 20.0000 mg | ORAL_TABLET | Freq: Every day | ORAL | 1 refills | Status: AC

## 2017-07-18 MED ORDER — TRAZODONE HCL 50 MG PO TABS: ORAL_TABLET | ORAL | 1 refills | Status: AC

## 2017-07-18 MED ORDER — OMEPRAZOLE 40 MG PO CPDR: 40.0000 mg | DELAYED_RELEASE_CAPSULE | Freq: Two times a day (BID) | ORAL | 3 refills | Status: AC

## 2017-07-18 MED ORDER — LEVOTHYROXINE SODIUM 100 MCG PO TABS: ORAL_TABLET | ORAL | 1 refills | Status: AC

## 2017-07-18 MED ORDER — SERTRALINE HCL 100 MG PO TABS: ORAL_TABLET | ORAL | 1 refills | Status: AC

## 2017-07-18 MED ORDER — BUPROPION HCL ER (XL) 150 MG PO TB24: 150.0000 mg | ORAL_TABLET | Freq: Every day | ORAL | 1 refills | Status: AC

## 2017-07-18 NOTE — Progress Notes (Signed)
Subjective:    Patient ID: Carolyn Campos, female    DOB: [birth year], 66 y.o.   MRN: [id number]  07/18/2017  Annual Exam    HPI This 66 y.o. female presents for evaluation of ALS, hypertension, hypercholesterolemia, anxiety/depression.  Management changes made at last visit include:  Tube feeding since May 2018. Drinks Boost.  Still take medication by pudding.  Trying to crush it via tube.  A lot of effort to get medication in. Sip/hold/swallow.   -new diagnosis of ALS; records reviewed in detail during visit; excellent family support and everyone coping well.   -obtain labs for chronic disease management. -refills of all medications provided. -s/p Prevnar 13. -no changes to management other than decreasing dose of Xanax.    Daughter quit job on July 11, 2017.  Totally emotional.  Since being home for one week, best decision ever.   Feels good.  Rarely feels badly.  When made to do something, gets really upset.  Cannot talk anymore.  ABle to communicate moderately well.  Daughter needs to  Has days where she can understand very well.   Bedtime is in recliner at living room with mask on.  Possibly now may sleep better since moving to living room in last 2-3 weeks.  Bedtime is 9-10.  Daughter not staying overnight.  Last medication at 9:30; Ave Filterhandler and Tomie ChinaZane are in charge in nighttime meds.  Chandler and Zane both spending up.  Husband there also.  Leg muscles are really weak.  Uses rolling walker in the home.  Four steps in the home.  Will get in [facility] in January 2019.  If she wants to get out and socialize can go on Monday-Friday.  Mondays and Wednesdays.  To help more at home, cannot get Hospice in house.  Too independent right now.  She can still perform ADLs, does not qualify for assistance.  No longer drives.   Needed increase in PPI.  BP Readings from Last 3 Encounters:  07/18/17 122/70  10/27/16 136/64  06/01/16 136/64   Wt Readings from Last 3 Encounters:  07/18/17 191 lb  (86.6 kg)  10/27/16 215 lb 6.4 oz (97.7 kg)  06/01/16 213 lb (96.6 kg)   Immunization History  Administered Date(s) Administered  . Influenza Split 05/16/2013, 06/02/2015  . Influenza, High Dose Seasonal PF 06/21/2017  . Influenza-Unspecified 05/16/2014, 05/16/2016  . Pneumococcal Conjugate-13 10/27/2016  . Td 08/17/2007    Review of Systems  Constitutional: Negative for chills, diaphoresis, fatigue and fever.  HENT: Positive for trouble swallowing and voice change.   Eyes: Negative for visual disturbance.  Respiratory: Negative for cough and shortness of breath.   Cardiovascular: Negative for chest pain, palpitations and leg swelling.  Gastrointestinal: Negative for abdominal pain, constipation, diarrhea, nausea and vomiting.  Endocrine: Negative for cold intolerance, heat intolerance, polydipsia, polyphagia and polyuria.  Musculoskeletal: Negative for arthralgias, back pain and gait problem.  Neurological: Negative for dizziness, tremors, seizures, syncope, facial asymmetry, speech difficulty, weakness, light-headedness, numbness and headaches.  Psychiatric/Behavioral: Negative for dysphoric mood and sleep disturbance. The patient is not nervous/anxious.     Past Medical History:  Diagnosis Date  . Abdominal pain, unspecified site   . 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 Anxiety state, unspecified   . Chest pain, unspecified   . Chest pain, unspecified   . Depressive disorder, not elsewhere classified   . Heart murmur    hx  . Hypertension   . Insomnia, unspecified   . Postmenopausal bleeding   .  Pure hypercholesterolemia   . Shortness of breath dyspnea   . Unspecified hypothyroidism    Past Surgical History:  Procedure Laterality Date  . ABDOMINAL HYSTERECTOMY  04/16/2012   one ovary resected.  No malignancy.  Post-menopausal bleeding.  [facility].  Marland Kitchen arm surgery     x 4 post accident at work, with skin graft, [facility]  . CESAREAN SECTION      x 2  . COLONOSCOPY  08/16/2009   normal.  [facility].  Repeat in 10 years.  Marland Kitchen DILATION AND CURETTAGE OF UTERUS    . ESOPHAGOGASTRODUODENOSCOPY (EGD) WITH PROPOFOL N/A 12/01/2015   Procedure: ESOPHAGOGASTRODUODENOSCOPY (EGD) WITH PROPOFOL;  Surgeon: Christena Deem, MD;  Location: [facility] ENDOSCOPY;  Service: Endoscopy;  Laterality: N/A;  . ESOPHAGOGASTRODUODENOSCOPY (EGD) WITH PROPOFOL N/A 03/15/2016   Procedure: ESOPHAGOGASTRODUODENOSCOPY (EGD) WITH PROPOFOL;  Surgeon: Christena Deem, MD;  Location: [facility] ENDOSCOPY;  Service: Endoscopy;  Laterality: N/A;   Allergies  Allergen Reactions  . Cephalexin Rash   Current Outpatient Medications on File Prior to Visit  Medication Sig Dispense Refill  . albuterol (PROVENTIL HFA;VENTOLIN HFA) 108 (90 Base) MCG/ACT inhaler Inhale 2 puffs into the lungs every 6 (six) hours as needed for wheezing or shortness of breath. 1 Inhaler 4  . ALPRAZolam (XANAX) 0.25 MG tablet Take 1-2 tablets (0.25-0.5 mg total) by mouth at bedtime as needed for anxiety. 60 tablet 5  . aspirin 81 MG tablet Take 81 mg by mouth daily.    Marland Kitchen Dextromethorphan-Quinidine 20-10 MG CAPS Take 1 capsule daily for 7 days and then 1 capsule every 12 hours after that.    . Fiber TABS 500 mg. Take one daily.    . fish oil-omega-3 fatty acids 1000 MG capsule Take 2 g by mouth daily.    . folic acid (FOLVITE) 1 MG tablet Take by mouth.    . losartan (COZAAR) 25 MG tablet TAKE 1 TABLET DAILY. 15 tablet 0  . riluzole (RILUTEK) 50 MG tablet Take by mouth.    . triamcinolone cream (KENALOG) 0.1 % Apply topically 2 (two) times daily. 30 g 0  . vitamin B-12 (CYANOCOBALAMIN) 1000 MCG tablet Take 1 tablet (1,000 mcg total) by mouth daily. 30 tablet 11   No current facility-administered medications on file prior to visit.    Social History   Socioeconomic History  . Marital status: Married    Spouse name: Not on file  . Number of children: 2  . Years of education: 75  . Highest education level: Not  on file  Social Needs  . Financial resource strain: Not on file  . Food insecurity - worry: Not on file  . Food insecurity - inability: Not on file  . Transportation needs - medical: Not on file  . Transportation needs - non-medical: Not on file  Occupational History  . Occupation: disabled    Comment: due to arm surgery  Tobacco Use  . Smoking status: Never Smoker  . Smokeless tobacco: Never Used  Substance and Sexual Activity  . Alcohol use: No  . Drug use: No  . Sexual activity: Not on file  Other Topics Concern  . Not on file  Social History Narrative   Sexual activity:  Not sexually active.       Seatbelt:  Always uses seat belts.       Smoke alarm and carbon monoxide detector in the home.       Guns: Has no unsecured guns in the home.  Caffeine:  Does not consume any caffeine.       Exercise: walks daily for 30 minutes walks 1-2 miles about every other day. Has dog.       Marital status:  Married x 41 years; moderately happy; married; no abuse;       Children:  2 daughters, 3 grandchildren/sons.       Organ donor: Yes.       Living Will: Patient does not have living will, DOES have HCPOA.      Employment: disability 2010 for [employer]man's comp injury arm.   Family History  Problem Relation Age of Onset  . Heart disease Brother        from drug abuse  . Alcohol abuse Brother   . Cirrhosis Brother   . COPD Mother   . Heart disease Mother        CHF  . Heart failure Mother   . Pulmonary fibrosis Mother        died in 2009  . Cancer Father        lung, prostate  . Hyperlipidemia Father   . Heart disease Father [id number]       CABG  . Ulcers Brother   . Breast cancer Paternal Aunt 1974       Objective:    BP 122/70   Pulse 76   Temp 98 F (36.7 C) (Oral)   Resp 16   Ht 5' 3.39" (1.61 m)   Wt 191 lb (86.6 kg)   SpO2 95%   BMI 33.42 kg/m  Physical Exam  Constitutional: She is oriented to person, place, and time. She appears well-developed and well-nourished.  No distress.  HENT:  Head: Normocephalic and atraumatic.  Right Ear: External ear normal.  Left Ear: External ear normal.  Nose: Nose normal.  Mouth/Throat: Oropharynx is clear and moist.  Eyes: Conjunctivae and EOM are normal. Pupils are equal, round, and reactive to light.  Neck: Normal range of motion. Neck supple. Carotid bruit is not present. No thyromegaly present.  Cardiovascular: Normal rate, regular rhythm, normal heart sounds and intact distal pulses. Exam reveals no gallop and no friction rub.  No murmur heard. Pulmonary/Chest: Effort normal and breath sounds normal. She has no wheezes. She has no rales.  Abdominal: Soft. Bowel sounds are normal. She exhibits no distension and no mass. There is no tenderness. There is no rebound and no guarding.  Lymphadenopathy:    She has no cervical adenopathy.  Neurological: She is alert and oriented to person, place, and time. No cranial nerve deficit.  Skin: Skin is warm and dry. No rash noted. She is not diaphoretic. No erythema. No pallor.  Psychiatric: She has a normal mood and affect. Her behavior is normal.   No results found. [facility] 07/18/2017 10/27/2016 06/01/2016 09/15/2015 06/16/2015  Decreased Interest 0 0 0 1 3  Down, Depressed, Hopeless 0 0 0 3 2  PHQ - 2 Score 0 0 0 4 5  Altered sleeping - - - 0 1  Tired, decreased energy - - - 3 3  Change in appetite - - - 3 3  Feeling bad or failure about yourself  - - - 3 3  Trouble concentrating - - - 2 1  Moving slowly or fidgety/restless - - - 2 0  Suicidal thoughts - - - 2 1  PHQ-9 Score - - - 19 17   Fall Risk  07/18/2017 10/27/2016 06/01/2016 09/15/2015 06/16/2015  Falls in the past year?  Yes No Yes No No  Number falls in past yr: 2 or more - 1 - -  Injury with Fall? No - - - -        Assessment & Plan:   1. Amyotrophic lateral sclerosis (ALS) (HCC)   2. Dysarthria   3. Essential hypertension, benign   4. Hypothyroidism due to acquired atrophy of  thyroid   5. Anxiety and depression   6. Other abnormal glucose   7. Pure hypercholesterolemia   8. Primary insomnia   9. Gastroesophageal reflux disease without esophagitis   10. Pharyngoesophageal dysphagia     Progressive ALS with further decline in lung function yet maintained with non-invasive ventilation and stable.  Followed by neurology.  Has feeding tube for nutrition; excellent family support; daughter has quit job and is primary caregiver.  Has two grandsons adult who stay at night with patient.    Obtain labs for chronic disease management; tolerating medications crushed or via feeding tube. Medication adjustments will likely be warranted with further weight loss. Will discontinue Losartan as blood pressure well controlled. Consider discontinuing Atorvastatin therapy.  Also recommend weaning Wellbutrin to off; take every other day for two weeks and then every third day for one week and then stop.  Patient will be transitioning to [facility] in January 2019 and will establish with facility provider.  I am available to assist patient at anytime despite this transition.   Orders Placed This Encounter  Procedures  . CBC with Differential/Platelet  . Comprehensive metabolic panel    Order Specific Question:   Has the patient fasted?    Answer:   No  . Hemoglobin A1c  . Lipid panel    Order Specific Question:   Has the patient fasted?    Answer:   No  . TSH  . POCT urinalysis dipstick   Meds ordered this encounter  Medications  . traZODone (DESYREL) 50 MG tablet    Sig: 1 TABLET BY MOUTH AT BEDTIME. AS NEEDED    Dispense:  90 tablet    Refill:  1  . sertraline (ZOLOFT) 100 MG tablet    Sig: 1 tablet daily    Dispense:  90 tablet    Refill:  1  . omeprazole (PRILOSEC) 40 MG capsule    Sig: Take 1 capsule (40 mg total) by mouth 2 (two) times daily.    Dispense:  180 capsule    Refill:  3  . levothyroxine (SYNTHROID, LEVOTHROID) 100 MCG tablet    Sig: TAKE 1 TABLET ON AN  EMPTY STOMACH WITH A GLASS OF WATER AT LEAST 30 TO 60 MINUTES BEFORE BREAKFAST.    Dispense:  90 tablet    Refill:  1  . buPROPion (WELLBUTRIN XL) 150 MG 24 hr tablet    Sig: Take 1 tablet (150 mg total) by mouth daily. Office visit needed    Dispense:  90 tablet    Refill:  1  . atorvastatin (LIPITOR) 20 MG tablet    Sig: Take 1 tablet (20 mg total) by mouth daily.    Dispense:  90 tablet    Refill:  1    Return in about 6 months (around 01/16/2018) for follow-up chronic medical conditions.   Kristi Paulita Fujita, M.D. Primary Care at [facility] previously [facility] [address] [place], [place]  [phone] phone [phone] fax

## 2017-07-18 NOTE — Patient Instructions (Addendum)
  Stop Losartan. Wean Wellbutrin.     IF you received an x-ray today, you will receive an invoice from [facility]. Please contact [facility] at [phone] with questions or concerns regarding your invoice.   IF you received labwork today, you will receive an invoice from [facility]. Please contact [facility] at [phone] with questions or concerns regarding your invoice.   Our billing staff will not be able to assist you with questions regarding bills from these companies.  You will be contacted with the lab results as soon as they are available. The fastest way to get your results is to activate your My Chart account. Instructions are located on the last page of this paperwork. If you have not heard from us regarding the results in 2 weeks, please contact this office.

## 2017-07-19 LAB — COMPREHENSIVE METABOLIC PANEL
A/G RATIO: 1.4 (ref 1.2–2.2)
ALT: 35 IU/L — AB (ref 0–32)
AST: 31 IU/L (ref 0–40)
Albumin: 4.3 g/dL (ref 3.6–4.8)
Alkaline Phosphatase: 100 IU/L (ref 39–117)
BUN/Creatinine Ratio: 33 — ABNORMAL HIGH (ref 12–28)
BUN: 23 mg/dL (ref 8–27)
Bilirubin Total: 0.4 mg/dL (ref 0.0–1.2)
CALCIUM: 10.2 mg/dL (ref 8.7–10.3)
CO2: 27 mmol/L (ref 20–29)
CREATININE: 0.7 mg/dL (ref 0.57–1.00)
Chloride: 100 mmol/L (ref 96–106)
GFR calc Af Amer: 105 mL/min/{1.73_m2} (ref >59)
GFR, EST NON AFRICAN AMERICAN: 91 mL/min/{1.73_m2} (ref >59)
Globulin, Total: 3 g/dL (ref 1.5–4.5)
Glucose: 97 mg/dL (ref 65–99)
POTASSIUM: 4.2 mmol/L (ref 3.5–5.2)
SODIUM: 142 mmol/L (ref 134–144)
Total Protein: 7.3 g/dL (ref 6.0–8.5)

## 2017-07-19 LAB — CBC WITH DIFFERENTIAL/PLATELET
Basophils Absolute: 0 10*3/uL (ref 0.0–0.2)
Basos: 0 %
EOS (ABSOLUTE): 0.1 10*3/uL (ref 0.0–0.4)
Eos: 2 %
Hematocrit: 38.8 % (ref 34.0–46.6)
Hemoglobin: 13 g/dL (ref 11.1–15.9)
IMMATURE GRANULOCYTES: 0 %
Immature Grans (Abs): 0 10*3/uL (ref 0.0–0.1)
Lymphocytes Absolute: 1.8 10*3/uL (ref 0.7–3.1)
Lymphs: 31 %
MCH: 32.8 pg (ref 26.6–33.0)
MCHC: 33.5 g/dL (ref 31.5–35.7)
MCV: 98 fL — ABNORMAL HIGH (ref 79–97)
MONOS ABS: 0.5 10*3/uL (ref 0.1–0.9)
Monocytes: 9 %
NEUTROS PCT: 58 %
Neutrophils Absolute: 3.4 10*3/uL (ref 1.4–7.0)
PLATELETS: 235 10*3/uL (ref 150–379)
RBC: 3.96 x10E6/uL (ref 3.77–5.28)
RDW: 13.3 % (ref 12.3–15.4)
WBC: 5.9 10*3/uL (ref 3.4–10.8)

## 2017-07-19 LAB — LIPID PANEL
CHOLESTEROL TOTAL: 133 mg/dL (ref 100–199)
Chol/HDL Ratio: 2 ratio (ref 0.0–4.4)
HDL: 66 mg/dL (ref >39)
LDL Calculated: 55 mg/dL (ref 0–99)
Triglycerides: 61 mg/dL (ref 0–149)
VLDL CHOLESTEROL CAL: 12 mg/dL (ref 5–40)

## 2017-07-19 LAB — HEMOGLOBIN A1C
ESTIMATED AVERAGE GLUCOSE: 105 mg/dL
HEMOGLOBIN A1C: 5.3 % (ref 4.8–5.6)

## 2017-07-19 LAB — TSH: TSH: 2.38 u[IU]/mL (ref 0.450–4.500)

## 2017-07-21 ENCOUNTER — Other Ambulatory Visit: Payer: Self-pay | Admitting: Family Medicine

## 2017-07-21 DIAGNOSIS — F5101 Primary insomnia: Secondary | ICD-10-CM

## 2017-08-05 DIAGNOSIS — Z9911 Dependence on respirator [ventilator] status: Secondary | ICD-10-CM | POA: Diagnosis not present

## 2017-08-05 DIAGNOSIS — Z931 Gastrostomy status: Secondary | ICD-10-CM | POA: Diagnosis not present

## 2017-08-05 DIAGNOSIS — R634 Abnormal weight loss: Secondary | ICD-10-CM | POA: Diagnosis not present

## 2017-08-05 DIAGNOSIS — R1310 Dysphagia, unspecified: Secondary | ICD-10-CM | POA: Diagnosis not present

## 2017-08-05 DIAGNOSIS — I10 Essential (primary) hypertension: Secondary | ICD-10-CM | POA: Diagnosis not present

## 2017-08-05 DIAGNOSIS — R471 Dysarthria and anarthria: Secondary | ICD-10-CM | POA: Diagnosis not present

## 2017-08-05 DIAGNOSIS — J984 Other disorders of lung: Secondary | ICD-10-CM | POA: Diagnosis not present

## 2017-08-05 DIAGNOSIS — R296 Repeated falls: Secondary | ICD-10-CM | POA: Diagnosis not present

## 2017-08-05 DIAGNOSIS — G1221 Amyotrophic lateral sclerosis: Secondary | ICD-10-CM | POA: Diagnosis not present

## 2017-08-06 DIAGNOSIS — R1310 Dysphagia, unspecified: Secondary | ICD-10-CM | POA: Diagnosis not present

## 2017-08-06 DIAGNOSIS — R471 Dysarthria and anarthria: Secondary | ICD-10-CM | POA: Diagnosis not present

## 2017-08-06 DIAGNOSIS — J984 Other disorders of lung: Secondary | ICD-10-CM | POA: Diagnosis not present

## 2017-08-06 DIAGNOSIS — I10 Essential (primary) hypertension: Secondary | ICD-10-CM | POA: Diagnosis not present

## 2017-08-06 DIAGNOSIS — R634 Abnormal weight loss: Secondary | ICD-10-CM | POA: Diagnosis not present

## 2017-08-06 DIAGNOSIS — G1221 Amyotrophic lateral sclerosis: Secondary | ICD-10-CM | POA: Diagnosis not present

## 2017-08-11 DIAGNOSIS — I10 Essential (primary) hypertension: Secondary | ICD-10-CM | POA: Diagnosis not present

## 2017-08-11 DIAGNOSIS — R634 Abnormal weight loss: Secondary | ICD-10-CM | POA: Diagnosis not present

## 2017-08-11 DIAGNOSIS — G1221 Amyotrophic lateral sclerosis: Secondary | ICD-10-CM | POA: Diagnosis not present

## 2017-08-11 DIAGNOSIS — R471 Dysarthria and anarthria: Secondary | ICD-10-CM | POA: Diagnosis not present

## 2017-08-11 DIAGNOSIS — J984 Other disorders of lung: Secondary | ICD-10-CM | POA: Diagnosis not present

## 2017-08-11 DIAGNOSIS — R1310 Dysphagia, unspecified: Secondary | ICD-10-CM | POA: Diagnosis not present

## 2017-08-15 DIAGNOSIS — I10 Essential (primary) hypertension: Secondary | ICD-10-CM | POA: Diagnosis not present

## 2017-08-15 DIAGNOSIS — R471 Dysarthria and anarthria: Secondary | ICD-10-CM | POA: Diagnosis not present

## 2017-08-15 DIAGNOSIS — R1310 Dysphagia, unspecified: Secondary | ICD-10-CM | POA: Diagnosis not present

## 2017-08-15 DIAGNOSIS — J984 Other disorders of lung: Secondary | ICD-10-CM | POA: Diagnosis not present

## 2017-08-15 DIAGNOSIS — R634 Abnormal weight loss: Secondary | ICD-10-CM | POA: Diagnosis not present

## 2017-08-15 DIAGNOSIS — G1221 Amyotrophic lateral sclerosis: Secondary | ICD-10-CM | POA: Diagnosis not present

## 2017-08-16 DIAGNOSIS — R1310 Dysphagia, unspecified: Secondary | ICD-10-CM | POA: Diagnosis not present

## 2017-08-16 DIAGNOSIS — R296 Repeated falls: Secondary | ICD-10-CM | POA: Diagnosis not present

## 2017-08-16 DIAGNOSIS — K219 Gastro-esophageal reflux disease without esophagitis: Secondary | ICD-10-CM | POA: Diagnosis not present

## 2017-08-16 DIAGNOSIS — G1221 Amyotrophic lateral sclerosis: Secondary | ICD-10-CM | POA: Diagnosis not present

## 2017-08-16 DIAGNOSIS — I10 Essential (primary) hypertension: Secondary | ICD-10-CM | POA: Diagnosis not present

## 2017-08-16 DIAGNOSIS — J984 Other disorders of lung: Secondary | ICD-10-CM | POA: Diagnosis not present

## 2017-08-16 DIAGNOSIS — R634 Abnormal weight loss: Secondary | ICD-10-CM | POA: Diagnosis not present

## 2017-08-16 DIAGNOSIS — Z931 Gastrostomy status: Secondary | ICD-10-CM | POA: Diagnosis not present

## 2017-08-16 DIAGNOSIS — R471 Dysarthria and anarthria: Secondary | ICD-10-CM | POA: Diagnosis not present

## 2017-08-16 DIAGNOSIS — Z9911 Dependence on respirator [ventilator] status: Secondary | ICD-10-CM | POA: Diagnosis not present

## 2017-08-23 DIAGNOSIS — J984 Other disorders of lung: Secondary | ICD-10-CM | POA: Diagnosis not present

## 2017-08-23 DIAGNOSIS — R634 Abnormal weight loss: Secondary | ICD-10-CM | POA: Diagnosis not present

## 2017-08-23 DIAGNOSIS — I10 Essential (primary) hypertension: Secondary | ICD-10-CM | POA: Diagnosis not present

## 2017-08-23 DIAGNOSIS — R1310 Dysphagia, unspecified: Secondary | ICD-10-CM | POA: Diagnosis not present

## 2017-08-23 DIAGNOSIS — G1221 Amyotrophic lateral sclerosis: Secondary | ICD-10-CM | POA: Diagnosis not present

## 2017-08-23 DIAGNOSIS — R471 Dysarthria and anarthria: Secondary | ICD-10-CM | POA: Diagnosis not present

## 2017-08-30 DIAGNOSIS — G1221 Amyotrophic lateral sclerosis: Secondary | ICD-10-CM | POA: Diagnosis not present

## 2017-08-30 DIAGNOSIS — I10 Essential (primary) hypertension: Secondary | ICD-10-CM | POA: Diagnosis not present

## 2017-08-30 DIAGNOSIS — R634 Abnormal weight loss: Secondary | ICD-10-CM | POA: Diagnosis not present

## 2017-08-30 DIAGNOSIS — J984 Other disorders of lung: Secondary | ICD-10-CM | POA: Diagnosis not present

## 2017-08-30 DIAGNOSIS — R1310 Dysphagia, unspecified: Secondary | ICD-10-CM | POA: Diagnosis not present

## 2017-08-30 DIAGNOSIS — R471 Dysarthria and anarthria: Secondary | ICD-10-CM | POA: Diagnosis not present

## 2017-09-01 DIAGNOSIS — J984 Other disorders of lung: Secondary | ICD-10-CM | POA: Diagnosis not present

## 2017-09-01 DIAGNOSIS — R634 Abnormal weight loss: Secondary | ICD-10-CM | POA: Diagnosis not present

## 2017-09-01 DIAGNOSIS — I10 Essential (primary) hypertension: Secondary | ICD-10-CM | POA: Diagnosis not present

## 2017-09-01 DIAGNOSIS — R1310 Dysphagia, unspecified: Secondary | ICD-10-CM | POA: Diagnosis not present

## 2017-09-01 DIAGNOSIS — G1221 Amyotrophic lateral sclerosis: Secondary | ICD-10-CM | POA: Diagnosis not present

## 2017-09-01 DIAGNOSIS — R471 Dysarthria and anarthria: Secondary | ICD-10-CM | POA: Diagnosis not present

## 2017-09-08 DIAGNOSIS — R471 Dysarthria and anarthria: Secondary | ICD-10-CM | POA: Diagnosis not present

## 2017-09-08 DIAGNOSIS — I10 Essential (primary) hypertension: Secondary | ICD-10-CM | POA: Diagnosis not present

## 2017-09-08 DIAGNOSIS — J984 Other disorders of lung: Secondary | ICD-10-CM | POA: Diagnosis not present

## 2017-09-08 DIAGNOSIS — G1221 Amyotrophic lateral sclerosis: Secondary | ICD-10-CM | POA: Diagnosis not present

## 2017-09-08 DIAGNOSIS — R634 Abnormal weight loss: Secondary | ICD-10-CM | POA: Diagnosis not present

## 2017-09-08 DIAGNOSIS — R1310 Dysphagia, unspecified: Secondary | ICD-10-CM | POA: Diagnosis not present

## 2017-09-14 DIAGNOSIS — R634 Abnormal weight loss: Secondary | ICD-10-CM | POA: Diagnosis not present

## 2017-09-14 DIAGNOSIS — R1310 Dysphagia, unspecified: Secondary | ICD-10-CM | POA: Diagnosis not present

## 2017-09-14 DIAGNOSIS — G1221 Amyotrophic lateral sclerosis: Secondary | ICD-10-CM | POA: Diagnosis not present

## 2017-09-14 DIAGNOSIS — I10 Essential (primary) hypertension: Secondary | ICD-10-CM | POA: Diagnosis not present

## 2017-09-14 DIAGNOSIS — R471 Dysarthria and anarthria: Secondary | ICD-10-CM | POA: Diagnosis not present

## 2017-09-14 DIAGNOSIS — J984 Other disorders of lung: Secondary | ICD-10-CM | POA: Diagnosis not present

## 2017-09-15 DIAGNOSIS — G1221 Amyotrophic lateral sclerosis: Secondary | ICD-10-CM | POA: Diagnosis not present

## 2017-09-15 DIAGNOSIS — I10 Essential (primary) hypertension: Secondary | ICD-10-CM | POA: Diagnosis not present

## 2017-09-15 DIAGNOSIS — R471 Dysarthria and anarthria: Secondary | ICD-10-CM | POA: Diagnosis not present

## 2017-09-15 DIAGNOSIS — R634 Abnormal weight loss: Secondary | ICD-10-CM | POA: Diagnosis not present

## 2017-09-15 DIAGNOSIS — J984 Other disorders of lung: Secondary | ICD-10-CM | POA: Diagnosis not present

## 2017-09-15 DIAGNOSIS — R1310 Dysphagia, unspecified: Secondary | ICD-10-CM | POA: Diagnosis not present

## 2017-09-16 ENCOUNTER — Encounter: Payer: Self-pay | Admitting: Family Medicine

## 2017-09-16 DIAGNOSIS — R1310 Dysphagia, unspecified: Secondary | ICD-10-CM | POA: Diagnosis not present

## 2017-09-16 DIAGNOSIS — G1221 Amyotrophic lateral sclerosis: Secondary | ICD-10-CM | POA: Diagnosis not present

## 2017-09-16 DIAGNOSIS — Z9911 Dependence on respirator [ventilator] status: Secondary | ICD-10-CM | POA: Diagnosis not present

## 2017-09-16 DIAGNOSIS — J984 Other disorders of lung: Secondary | ICD-10-CM | POA: Diagnosis not present

## 2017-09-16 DIAGNOSIS — K219 Gastro-esophageal reflux disease without esophagitis: Secondary | ICD-10-CM | POA: Diagnosis not present

## 2017-09-16 DIAGNOSIS — Z931 Gastrostomy status: Secondary | ICD-10-CM | POA: Diagnosis not present

## 2017-09-16 DIAGNOSIS — I10 Essential (primary) hypertension: Secondary | ICD-10-CM | POA: Diagnosis not present

## 2017-09-16 DIAGNOSIS — R471 Dysarthria and anarthria: Secondary | ICD-10-CM | POA: Diagnosis not present

## 2017-09-16 DIAGNOSIS — R634 Abnormal weight loss: Secondary | ICD-10-CM | POA: Diagnosis not present

## 2017-09-16 DIAGNOSIS — R296 Repeated falls: Secondary | ICD-10-CM | POA: Diagnosis not present

## 2017-09-21 DIAGNOSIS — R634 Abnormal weight loss: Secondary | ICD-10-CM | POA: Diagnosis not present

## 2017-09-21 DIAGNOSIS — R471 Dysarthria and anarthria: Secondary | ICD-10-CM | POA: Diagnosis not present

## 2017-09-21 DIAGNOSIS — R1310 Dysphagia, unspecified: Secondary | ICD-10-CM | POA: Diagnosis not present

## 2017-09-21 DIAGNOSIS — I10 Essential (primary) hypertension: Secondary | ICD-10-CM | POA: Diagnosis not present

## 2017-09-21 DIAGNOSIS — G1221 Amyotrophic lateral sclerosis: Secondary | ICD-10-CM | POA: Diagnosis not present

## 2017-09-21 DIAGNOSIS — J984 Other disorders of lung: Secondary | ICD-10-CM | POA: Diagnosis not present

## 2017-09-28 DIAGNOSIS — R1310 Dysphagia, unspecified: Secondary | ICD-10-CM | POA: Diagnosis not present

## 2017-09-28 DIAGNOSIS — G1221 Amyotrophic lateral sclerosis: Secondary | ICD-10-CM | POA: Diagnosis not present

## 2017-09-28 DIAGNOSIS — I10 Essential (primary) hypertension: Secondary | ICD-10-CM | POA: Diagnosis not present

## 2017-09-28 DIAGNOSIS — R471 Dysarthria and anarthria: Secondary | ICD-10-CM | POA: Diagnosis not present

## 2017-09-28 DIAGNOSIS — R634 Abnormal weight loss: Secondary | ICD-10-CM | POA: Diagnosis not present

## 2017-09-28 DIAGNOSIS — J984 Other disorders of lung: Secondary | ICD-10-CM | POA: Diagnosis not present

## 2017-09-30 DIAGNOSIS — R634 Abnormal weight loss: Secondary | ICD-10-CM | POA: Diagnosis not present

## 2017-09-30 DIAGNOSIS — G1221 Amyotrophic lateral sclerosis: Secondary | ICD-10-CM | POA: Diagnosis not present

## 2017-09-30 DIAGNOSIS — R471 Dysarthria and anarthria: Secondary | ICD-10-CM | POA: Diagnosis not present

## 2017-09-30 DIAGNOSIS — R1310 Dysphagia, unspecified: Secondary | ICD-10-CM | POA: Diagnosis not present

## 2017-09-30 DIAGNOSIS — I10 Essential (primary) hypertension: Secondary | ICD-10-CM | POA: Diagnosis not present

## 2017-09-30 DIAGNOSIS — J984 Other disorders of lung: Secondary | ICD-10-CM | POA: Diagnosis not present

## 2017-10-04 DIAGNOSIS — Z5181 Encounter for therapeutic drug level monitoring: Secondary | ICD-10-CM | POA: Diagnosis not present

## 2017-10-04 DIAGNOSIS — Z9911 Dependence on respirator [ventilator] status: Secondary | ICD-10-CM | POA: Diagnosis not present

## 2017-10-04 DIAGNOSIS — J984 Other disorders of lung: Secondary | ICD-10-CM | POA: Diagnosis not present

## 2017-10-04 DIAGNOSIS — G709 Myoneural disorder, unspecified: Secondary | ICD-10-CM | POA: Diagnosis not present

## 2017-10-04 DIAGNOSIS — G1221 Amyotrophic lateral sclerosis: Secondary | ICD-10-CM | POA: Diagnosis not present

## 2017-10-05 DIAGNOSIS — R471 Dysarthria and anarthria: Secondary | ICD-10-CM | POA: Diagnosis not present

## 2017-10-05 DIAGNOSIS — R1310 Dysphagia, unspecified: Secondary | ICD-10-CM | POA: Diagnosis not present

## 2017-10-05 DIAGNOSIS — J984 Other disorders of lung: Secondary | ICD-10-CM | POA: Diagnosis not present

## 2017-10-05 DIAGNOSIS — G1221 Amyotrophic lateral sclerosis: Secondary | ICD-10-CM | POA: Diagnosis not present

## 2017-10-05 DIAGNOSIS — R634 Abnormal weight loss: Secondary | ICD-10-CM | POA: Diagnosis not present

## 2017-10-05 DIAGNOSIS — I10 Essential (primary) hypertension: Secondary | ICD-10-CM | POA: Diagnosis not present

## 2017-10-07 DIAGNOSIS — R1310 Dysphagia, unspecified: Secondary | ICD-10-CM | POA: Diagnosis not present

## 2017-10-07 DIAGNOSIS — R471 Dysarthria and anarthria: Secondary | ICD-10-CM | POA: Diagnosis not present

## 2017-10-07 DIAGNOSIS — I10 Essential (primary) hypertension: Secondary | ICD-10-CM | POA: Diagnosis not present

## 2017-10-07 DIAGNOSIS — G1221 Amyotrophic lateral sclerosis: Secondary | ICD-10-CM | POA: Diagnosis not present

## 2017-10-07 DIAGNOSIS — R634 Abnormal weight loss: Secondary | ICD-10-CM | POA: Diagnosis not present

## 2017-10-07 DIAGNOSIS — J984 Other disorders of lung: Secondary | ICD-10-CM | POA: Diagnosis not present

## 2017-10-10 DIAGNOSIS — I10 Essential (primary) hypertension: Secondary | ICD-10-CM | POA: Diagnosis not present

## 2017-10-10 DIAGNOSIS — R1310 Dysphagia, unspecified: Secondary | ICD-10-CM | POA: Diagnosis not present

## 2017-10-10 DIAGNOSIS — G1221 Amyotrophic lateral sclerosis: Secondary | ICD-10-CM | POA: Diagnosis not present

## 2017-10-10 DIAGNOSIS — R634 Abnormal weight loss: Secondary | ICD-10-CM | POA: Diagnosis not present

## 2017-10-10 DIAGNOSIS — J984 Other disorders of lung: Secondary | ICD-10-CM | POA: Diagnosis not present

## 2017-10-10 DIAGNOSIS — R471 Dysarthria and anarthria: Secondary | ICD-10-CM | POA: Diagnosis not present

## 2017-10-12 DIAGNOSIS — R634 Abnormal weight loss: Secondary | ICD-10-CM | POA: Diagnosis not present

## 2017-10-12 DIAGNOSIS — J984 Other disorders of lung: Secondary | ICD-10-CM | POA: Diagnosis not present

## 2017-10-12 DIAGNOSIS — I10 Essential (primary) hypertension: Secondary | ICD-10-CM | POA: Diagnosis not present

## 2017-10-12 DIAGNOSIS — R471 Dysarthria and anarthria: Secondary | ICD-10-CM | POA: Diagnosis not present

## 2017-10-12 DIAGNOSIS — R1310 Dysphagia, unspecified: Secondary | ICD-10-CM | POA: Diagnosis not present

## 2017-10-12 DIAGNOSIS — G1221 Amyotrophic lateral sclerosis: Secondary | ICD-10-CM | POA: Diagnosis not present

## 2017-10-13 DIAGNOSIS — G1221 Amyotrophic lateral sclerosis: Secondary | ICD-10-CM | POA: Diagnosis not present

## 2017-10-13 DIAGNOSIS — J984 Other disorders of lung: Secondary | ICD-10-CM | POA: Diagnosis not present

## 2017-10-13 DIAGNOSIS — I10 Essential (primary) hypertension: Secondary | ICD-10-CM | POA: Diagnosis not present

## 2017-10-13 DIAGNOSIS — R634 Abnormal weight loss: Secondary | ICD-10-CM | POA: Diagnosis not present

## 2017-10-13 DIAGNOSIS — R471 Dysarthria and anarthria: Secondary | ICD-10-CM | POA: Diagnosis not present

## 2017-10-13 DIAGNOSIS — R1310 Dysphagia, unspecified: Secondary | ICD-10-CM | POA: Diagnosis not present

## 2017-10-14 DIAGNOSIS — Z931 Gastrostomy status: Secondary | ICD-10-CM | POA: Diagnosis not present

## 2017-10-14 DIAGNOSIS — I10 Essential (primary) hypertension: Secondary | ICD-10-CM | POA: Diagnosis not present

## 2017-10-14 DIAGNOSIS — K219 Gastro-esophageal reflux disease without esophagitis: Secondary | ICD-10-CM | POA: Diagnosis not present

## 2017-10-14 DIAGNOSIS — R471 Dysarthria and anarthria: Secondary | ICD-10-CM | POA: Diagnosis not present

## 2017-10-14 DIAGNOSIS — R1310 Dysphagia, unspecified: Secondary | ICD-10-CM | POA: Diagnosis not present

## 2017-10-14 DIAGNOSIS — J984 Other disorders of lung: Secondary | ICD-10-CM | POA: Diagnosis not present

## 2017-10-14 DIAGNOSIS — Z9911 Dependence on respirator [ventilator] status: Secondary | ICD-10-CM | POA: Diagnosis not present

## 2017-10-14 DIAGNOSIS — G1221 Amyotrophic lateral sclerosis: Secondary | ICD-10-CM | POA: Diagnosis not present

## 2017-10-14 DIAGNOSIS — R296 Repeated falls: Secondary | ICD-10-CM | POA: Diagnosis not present

## 2017-10-14 DIAGNOSIS — R634 Abnormal weight loss: Secondary | ICD-10-CM | POA: Diagnosis not present

## 2017-10-19 DIAGNOSIS — J984 Other disorders of lung: Secondary | ICD-10-CM | POA: Diagnosis not present

## 2017-10-19 DIAGNOSIS — R471 Dysarthria and anarthria: Secondary | ICD-10-CM | POA: Diagnosis not present

## 2017-10-19 DIAGNOSIS — R634 Abnormal weight loss: Secondary | ICD-10-CM | POA: Diagnosis not present

## 2017-10-19 DIAGNOSIS — R1310 Dysphagia, unspecified: Secondary | ICD-10-CM | POA: Diagnosis not present

## 2017-10-19 DIAGNOSIS — G1221 Amyotrophic lateral sclerosis: Secondary | ICD-10-CM | POA: Diagnosis not present

## 2017-10-19 DIAGNOSIS — I10 Essential (primary) hypertension: Secondary | ICD-10-CM | POA: Diagnosis not present

## 2017-10-21 DIAGNOSIS — R1310 Dysphagia, unspecified: Secondary | ICD-10-CM | POA: Diagnosis not present

## 2017-10-21 DIAGNOSIS — R634 Abnormal weight loss: Secondary | ICD-10-CM | POA: Diagnosis not present

## 2017-10-21 DIAGNOSIS — J984 Other disorders of lung: Secondary | ICD-10-CM | POA: Diagnosis not present

## 2017-10-21 DIAGNOSIS — G1221 Amyotrophic lateral sclerosis: Secondary | ICD-10-CM | POA: Diagnosis not present

## 2017-10-21 DIAGNOSIS — I10 Essential (primary) hypertension: Secondary | ICD-10-CM | POA: Diagnosis not present

## 2017-10-21 DIAGNOSIS — R471 Dysarthria and anarthria: Secondary | ICD-10-CM | POA: Diagnosis not present

## 2017-10-22 DIAGNOSIS — R471 Dysarthria and anarthria: Secondary | ICD-10-CM | POA: Diagnosis not present

## 2017-10-22 DIAGNOSIS — R634 Abnormal weight loss: Secondary | ICD-10-CM | POA: Diagnosis not present

## 2017-10-22 DIAGNOSIS — I10 Essential (primary) hypertension: Secondary | ICD-10-CM | POA: Diagnosis not present

## 2017-10-22 DIAGNOSIS — J984 Other disorders of lung: Secondary | ICD-10-CM | POA: Diagnosis not present

## 2017-10-22 DIAGNOSIS — G1221 Amyotrophic lateral sclerosis: Secondary | ICD-10-CM | POA: Diagnosis not present

## 2017-10-22 DIAGNOSIS — R1310 Dysphagia, unspecified: Secondary | ICD-10-CM | POA: Diagnosis not present

## 2017-10-26 DIAGNOSIS — R471 Dysarthria and anarthria: Secondary | ICD-10-CM | POA: Diagnosis not present

## 2017-10-26 DIAGNOSIS — I10 Essential (primary) hypertension: Secondary | ICD-10-CM | POA: Diagnosis not present

## 2017-10-26 DIAGNOSIS — G1221 Amyotrophic lateral sclerosis: Secondary | ICD-10-CM | POA: Diagnosis not present

## 2017-10-26 DIAGNOSIS — J984 Other disorders of lung: Secondary | ICD-10-CM | POA: Diagnosis not present

## 2017-10-26 DIAGNOSIS — R1310 Dysphagia, unspecified: Secondary | ICD-10-CM | POA: Diagnosis not present

## 2017-10-26 DIAGNOSIS — R634 Abnormal weight loss: Secondary | ICD-10-CM | POA: Diagnosis not present

## 2017-10-30 DIAGNOSIS — R471 Dysarthria and anarthria: Secondary | ICD-10-CM | POA: Diagnosis not present

## 2017-10-30 DIAGNOSIS — R1310 Dysphagia, unspecified: Secondary | ICD-10-CM | POA: Diagnosis not present

## 2017-10-30 DIAGNOSIS — J984 Other disorders of lung: Secondary | ICD-10-CM | POA: Diagnosis not present

## 2017-10-30 DIAGNOSIS — I10 Essential (primary) hypertension: Secondary | ICD-10-CM | POA: Diagnosis not present

## 2017-10-30 DIAGNOSIS — G1221 Amyotrophic lateral sclerosis: Secondary | ICD-10-CM | POA: Diagnosis not present

## 2017-10-30 DIAGNOSIS — R634 Abnormal weight loss: Secondary | ICD-10-CM | POA: Diagnosis not present

## 2017-10-31 DIAGNOSIS — G1221 Amyotrophic lateral sclerosis: Secondary | ICD-10-CM | POA: Diagnosis not present

## 2017-10-31 DIAGNOSIS — I10 Essential (primary) hypertension: Secondary | ICD-10-CM | POA: Diagnosis not present

## 2017-10-31 DIAGNOSIS — R471 Dysarthria and anarthria: Secondary | ICD-10-CM | POA: Diagnosis not present

## 2017-10-31 DIAGNOSIS — R1310 Dysphagia, unspecified: Secondary | ICD-10-CM | POA: Diagnosis not present

## 2017-10-31 DIAGNOSIS — R634 Abnormal weight loss: Secondary | ICD-10-CM | POA: Diagnosis not present

## 2017-10-31 DIAGNOSIS — J984 Other disorders of lung: Secondary | ICD-10-CM | POA: Diagnosis not present

## 2017-11-01 DIAGNOSIS — J984 Other disorders of lung: Secondary | ICD-10-CM | POA: Diagnosis not present

## 2017-11-01 DIAGNOSIS — R634 Abnormal weight loss: Secondary | ICD-10-CM | POA: Diagnosis not present

## 2017-11-01 DIAGNOSIS — R471 Dysarthria and anarthria: Secondary | ICD-10-CM | POA: Diagnosis not present

## 2017-11-01 DIAGNOSIS — R1310 Dysphagia, unspecified: Secondary | ICD-10-CM | POA: Diagnosis not present

## 2017-11-01 DIAGNOSIS — I10 Essential (primary) hypertension: Secondary | ICD-10-CM | POA: Diagnosis not present

## 2017-11-01 DIAGNOSIS — G1221 Amyotrophic lateral sclerosis: Secondary | ICD-10-CM | POA: Diagnosis not present

## 2017-11-02 DIAGNOSIS — R634 Abnormal weight loss: Secondary | ICD-10-CM | POA: Diagnosis not present

## 2017-11-02 DIAGNOSIS — R1310 Dysphagia, unspecified: Secondary | ICD-10-CM | POA: Diagnosis not present

## 2017-11-02 DIAGNOSIS — I10 Essential (primary) hypertension: Secondary | ICD-10-CM | POA: Diagnosis not present

## 2017-11-02 DIAGNOSIS — J984 Other disorders of lung: Secondary | ICD-10-CM | POA: Diagnosis not present

## 2017-11-02 DIAGNOSIS — G1221 Amyotrophic lateral sclerosis: Secondary | ICD-10-CM | POA: Diagnosis not present

## 2017-11-02 DIAGNOSIS — R471 Dysarthria and anarthria: Secondary | ICD-10-CM | POA: Diagnosis not present

## 2017-11-03 DIAGNOSIS — J984 Other disorders of lung: Secondary | ICD-10-CM | POA: Diagnosis not present

## 2017-11-03 DIAGNOSIS — R471 Dysarthria and anarthria: Secondary | ICD-10-CM | POA: Diagnosis not present

## 2017-11-03 DIAGNOSIS — R1310 Dysphagia, unspecified: Secondary | ICD-10-CM | POA: Diagnosis not present

## 2017-11-03 DIAGNOSIS — R634 Abnormal weight loss: Secondary | ICD-10-CM | POA: Diagnosis not present

## 2017-11-03 DIAGNOSIS — I10 Essential (primary) hypertension: Secondary | ICD-10-CM | POA: Diagnosis not present

## 2017-11-03 DIAGNOSIS — G1221 Amyotrophic lateral sclerosis: Secondary | ICD-10-CM | POA: Diagnosis not present

## 2017-11-08 DIAGNOSIS — R471 Dysarthria and anarthria: Secondary | ICD-10-CM | POA: Diagnosis not present

## 2017-11-08 DIAGNOSIS — R1310 Dysphagia, unspecified: Secondary | ICD-10-CM | POA: Diagnosis not present

## 2017-11-08 DIAGNOSIS — R634 Abnormal weight loss: Secondary | ICD-10-CM | POA: Diagnosis not present

## 2017-11-08 DIAGNOSIS — J984 Other disorders of lung: Secondary | ICD-10-CM | POA: Diagnosis not present

## 2017-11-08 DIAGNOSIS — G1221 Amyotrophic lateral sclerosis: Secondary | ICD-10-CM | POA: Diagnosis not present

## 2017-11-08 DIAGNOSIS — I10 Essential (primary) hypertension: Secondary | ICD-10-CM | POA: Diagnosis not present

## 2017-11-09 DIAGNOSIS — R634 Abnormal weight loss: Secondary | ICD-10-CM | POA: Diagnosis not present

## 2017-11-09 DIAGNOSIS — R471 Dysarthria and anarthria: Secondary | ICD-10-CM | POA: Diagnosis not present

## 2017-11-09 DIAGNOSIS — I10 Essential (primary) hypertension: Secondary | ICD-10-CM | POA: Diagnosis not present

## 2017-11-09 DIAGNOSIS — R1310 Dysphagia, unspecified: Secondary | ICD-10-CM | POA: Diagnosis not present

## 2017-11-09 DIAGNOSIS — J984 Other disorders of lung: Secondary | ICD-10-CM | POA: Diagnosis not present

## 2017-11-09 DIAGNOSIS — G1221 Amyotrophic lateral sclerosis: Secondary | ICD-10-CM | POA: Diagnosis not present

## 2017-11-10 DIAGNOSIS — G1221 Amyotrophic lateral sclerosis: Secondary | ICD-10-CM | POA: Diagnosis not present

## 2017-11-10 DIAGNOSIS — R1310 Dysphagia, unspecified: Secondary | ICD-10-CM | POA: Diagnosis not present

## 2017-11-10 DIAGNOSIS — R634 Abnormal weight loss: Secondary | ICD-10-CM | POA: Diagnosis not present

## 2017-11-10 DIAGNOSIS — I10 Essential (primary) hypertension: Secondary | ICD-10-CM | POA: Diagnosis not present

## 2017-11-10 DIAGNOSIS — R471 Dysarthria and anarthria: Secondary | ICD-10-CM | POA: Diagnosis not present

## 2017-11-10 DIAGNOSIS — J984 Other disorders of lung: Secondary | ICD-10-CM | POA: Diagnosis not present

## 2017-11-11 DIAGNOSIS — R634 Abnormal weight loss: Secondary | ICD-10-CM | POA: Diagnosis not present

## 2017-11-11 DIAGNOSIS — G1221 Amyotrophic lateral sclerosis: Secondary | ICD-10-CM | POA: Diagnosis not present

## 2017-11-11 DIAGNOSIS — J984 Other disorders of lung: Secondary | ICD-10-CM | POA: Diagnosis not present

## 2017-11-11 DIAGNOSIS — R1310 Dysphagia, unspecified: Secondary | ICD-10-CM | POA: Diagnosis not present

## 2017-11-11 DIAGNOSIS — I10 Essential (primary) hypertension: Secondary | ICD-10-CM | POA: Diagnosis not present

## 2017-11-11 DIAGNOSIS — R471 Dysarthria and anarthria: Secondary | ICD-10-CM | POA: Diagnosis not present

## 2017-11-14 DIAGNOSIS — I10 Essential (primary) hypertension: Secondary | ICD-10-CM | POA: Diagnosis not present

## 2017-11-14 DIAGNOSIS — Z931 Gastrostomy status: Secondary | ICD-10-CM | POA: Diagnosis not present

## 2017-11-14 DIAGNOSIS — K219 Gastro-esophageal reflux disease without esophagitis: Secondary | ICD-10-CM | POA: Diagnosis not present

## 2017-11-14 DIAGNOSIS — R296 Repeated falls: Secondary | ICD-10-CM | POA: Diagnosis not present

## 2017-11-14 DIAGNOSIS — R1310 Dysphagia, unspecified: Secondary | ICD-10-CM | POA: Diagnosis not present

## 2017-11-14 DIAGNOSIS — R471 Dysarthria and anarthria: Secondary | ICD-10-CM | POA: Diagnosis not present

## 2017-11-14 DIAGNOSIS — G1221 Amyotrophic lateral sclerosis: Secondary | ICD-10-CM | POA: Diagnosis not present

## 2017-11-14 DIAGNOSIS — J984 Other disorders of lung: Secondary | ICD-10-CM | POA: Diagnosis not present

## 2017-11-14 DIAGNOSIS — R634 Abnormal weight loss: Secondary | ICD-10-CM | POA: Diagnosis not present

## 2017-11-14 DIAGNOSIS — Z9911 Dependence on respirator [ventilator] status: Secondary | ICD-10-CM | POA: Diagnosis not present

## 2017-11-15 DIAGNOSIS — J984 Other disorders of lung: Secondary | ICD-10-CM | POA: Diagnosis not present

## 2017-11-15 DIAGNOSIS — R471 Dysarthria and anarthria: Secondary | ICD-10-CM | POA: Diagnosis not present

## 2017-11-15 DIAGNOSIS — G1221 Amyotrophic lateral sclerosis: Secondary | ICD-10-CM | POA: Diagnosis not present

## 2017-11-15 DIAGNOSIS — R634 Abnormal weight loss: Secondary | ICD-10-CM | POA: Diagnosis not present

## 2017-11-15 DIAGNOSIS — R1310 Dysphagia, unspecified: Secondary | ICD-10-CM | POA: Diagnosis not present

## 2017-11-15 DIAGNOSIS — I10 Essential (primary) hypertension: Secondary | ICD-10-CM | POA: Diagnosis not present

## 2017-11-17 DIAGNOSIS — R1310 Dysphagia, unspecified: Secondary | ICD-10-CM | POA: Diagnosis not present

## 2017-11-17 DIAGNOSIS — G1221 Amyotrophic lateral sclerosis: Secondary | ICD-10-CM | POA: Diagnosis not present

## 2017-11-17 DIAGNOSIS — R634 Abnormal weight loss: Secondary | ICD-10-CM | POA: Diagnosis not present

## 2017-11-17 DIAGNOSIS — I10 Essential (primary) hypertension: Secondary | ICD-10-CM | POA: Diagnosis not present

## 2017-11-17 DIAGNOSIS — J984 Other disorders of lung: Secondary | ICD-10-CM | POA: Diagnosis not present

## 2017-11-17 DIAGNOSIS — R471 Dysarthria and anarthria: Secondary | ICD-10-CM | POA: Diagnosis not present

## 2017-11-18 DIAGNOSIS — R1310 Dysphagia, unspecified: Secondary | ICD-10-CM | POA: Diagnosis not present

## 2017-11-18 DIAGNOSIS — R471 Dysarthria and anarthria: Secondary | ICD-10-CM | POA: Diagnosis not present

## 2017-11-18 DIAGNOSIS — J984 Other disorders of lung: Secondary | ICD-10-CM | POA: Diagnosis not present

## 2017-11-18 DIAGNOSIS — I10 Essential (primary) hypertension: Secondary | ICD-10-CM | POA: Diagnosis not present

## 2017-11-18 DIAGNOSIS — G1221 Amyotrophic lateral sclerosis: Secondary | ICD-10-CM | POA: Diagnosis not present

## 2017-11-18 DIAGNOSIS — R634 Abnormal weight loss: Secondary | ICD-10-CM | POA: Diagnosis not present

## 2017-11-19 DIAGNOSIS — R634 Abnormal weight loss: Secondary | ICD-10-CM | POA: Diagnosis not present

## 2017-11-19 DIAGNOSIS — R1310 Dysphagia, unspecified: Secondary | ICD-10-CM | POA: Diagnosis not present

## 2017-11-19 DIAGNOSIS — J984 Other disorders of lung: Secondary | ICD-10-CM | POA: Diagnosis not present

## 2017-11-19 DIAGNOSIS — G1221 Amyotrophic lateral sclerosis: Secondary | ICD-10-CM | POA: Diagnosis not present

## 2017-11-19 DIAGNOSIS — I10 Essential (primary) hypertension: Secondary | ICD-10-CM | POA: Diagnosis not present

## 2017-11-19 DIAGNOSIS — R471 Dysarthria and anarthria: Secondary | ICD-10-CM | POA: Diagnosis not present

## 2017-11-21 DIAGNOSIS — R471 Dysarthria and anarthria: Secondary | ICD-10-CM | POA: Diagnosis not present

## 2017-11-21 DIAGNOSIS — R634 Abnormal weight loss: Secondary | ICD-10-CM | POA: Diagnosis not present

## 2017-11-21 DIAGNOSIS — R1310 Dysphagia, unspecified: Secondary | ICD-10-CM | POA: Diagnosis not present

## 2017-11-21 DIAGNOSIS — G1221 Amyotrophic lateral sclerosis: Secondary | ICD-10-CM | POA: Diagnosis not present

## 2017-11-21 DIAGNOSIS — I10 Essential (primary) hypertension: Secondary | ICD-10-CM | POA: Diagnosis not present

## 2017-11-21 DIAGNOSIS — J984 Other disorders of lung: Secondary | ICD-10-CM | POA: Diagnosis not present

## 2017-11-22 DIAGNOSIS — R634 Abnormal weight loss: Secondary | ICD-10-CM | POA: Diagnosis not present

## 2017-11-22 DIAGNOSIS — R471 Dysarthria and anarthria: Secondary | ICD-10-CM | POA: Diagnosis not present

## 2017-11-22 DIAGNOSIS — I10 Essential (primary) hypertension: Secondary | ICD-10-CM | POA: Diagnosis not present

## 2017-11-22 DIAGNOSIS — R1310 Dysphagia, unspecified: Secondary | ICD-10-CM | POA: Diagnosis not present

## 2017-11-22 DIAGNOSIS — G1221 Amyotrophic lateral sclerosis: Secondary | ICD-10-CM | POA: Diagnosis not present

## 2017-11-22 DIAGNOSIS — J984 Other disorders of lung: Secondary | ICD-10-CM | POA: Diagnosis not present

## 2017-11-24 DIAGNOSIS — R471 Dysarthria and anarthria: Secondary | ICD-10-CM | POA: Diagnosis not present

## 2017-11-24 DIAGNOSIS — I10 Essential (primary) hypertension: Secondary | ICD-10-CM | POA: Diagnosis not present

## 2017-11-24 DIAGNOSIS — R1310 Dysphagia, unspecified: Secondary | ICD-10-CM | POA: Diagnosis not present

## 2017-11-24 DIAGNOSIS — R634 Abnormal weight loss: Secondary | ICD-10-CM | POA: Diagnosis not present

## 2017-11-24 DIAGNOSIS — J984 Other disorders of lung: Secondary | ICD-10-CM | POA: Diagnosis not present

## 2017-11-24 DIAGNOSIS — G1221 Amyotrophic lateral sclerosis: Secondary | ICD-10-CM | POA: Diagnosis not present

## 2017-11-28 DIAGNOSIS — I10 Essential (primary) hypertension: Secondary | ICD-10-CM | POA: Diagnosis not present

## 2017-11-28 DIAGNOSIS — R471 Dysarthria and anarthria: Secondary | ICD-10-CM | POA: Diagnosis not present

## 2017-11-28 DIAGNOSIS — R634 Abnormal weight loss: Secondary | ICD-10-CM | POA: Diagnosis not present

## 2017-11-28 DIAGNOSIS — R1310 Dysphagia, unspecified: Secondary | ICD-10-CM | POA: Diagnosis not present

## 2017-11-28 DIAGNOSIS — J984 Other disorders of lung: Secondary | ICD-10-CM | POA: Diagnosis not present

## 2017-11-28 DIAGNOSIS — G1221 Amyotrophic lateral sclerosis: Secondary | ICD-10-CM | POA: Diagnosis not present

## 2017-11-29 DIAGNOSIS — J984 Other disorders of lung: Secondary | ICD-10-CM | POA: Diagnosis not present

## 2017-11-29 DIAGNOSIS — R1310 Dysphagia, unspecified: Secondary | ICD-10-CM | POA: Diagnosis not present

## 2017-11-29 DIAGNOSIS — G1221 Amyotrophic lateral sclerosis: Secondary | ICD-10-CM | POA: Diagnosis not present

## 2017-11-29 DIAGNOSIS — R471 Dysarthria and anarthria: Secondary | ICD-10-CM | POA: Diagnosis not present

## 2017-11-29 DIAGNOSIS — I10 Essential (primary) hypertension: Secondary | ICD-10-CM | POA: Diagnosis not present

## 2017-11-29 DIAGNOSIS — R634 Abnormal weight loss: Secondary | ICD-10-CM | POA: Diagnosis not present

## 2017-12-14 DEATH — deceased

## 2018-01-10 ENCOUNTER — Encounter: Payer: Self-pay | Admitting: Family Medicine

## 2018-01-16 ENCOUNTER — Ambulatory Visit: Payer: Medicare Other | Admitting: Family Medicine
